# Patient Record
Sex: Female | Born: 1950 | Race: White | Hispanic: No | Marital: Single | Smoking: Never smoker
Health system: Southern US, Community
[De-identification: ages and names within clinical notes are randomized; demographics above are authoritative.]

## PROBLEM LIST (undated history)

## (undated) DIAGNOSIS — E669 Obesity, unspecified: Secondary | ICD-10-CM

## (undated) DIAGNOSIS — E66811 Obesity, class 1: Secondary | ICD-10-CM

## (undated) DIAGNOSIS — G4733 Obstructive sleep apnea (adult) (pediatric): Secondary | ICD-10-CM

## (undated) DIAGNOSIS — Z9989 Dependence on other enabling machines and devices: Secondary | ICD-10-CM

## (undated) DIAGNOSIS — K219 Gastro-esophageal reflux disease without esophagitis: Secondary | ICD-10-CM

## (undated) DIAGNOSIS — I480 Paroxysmal atrial fibrillation: Secondary | ICD-10-CM

## (undated) DIAGNOSIS — M1990 Unspecified osteoarthritis, unspecified site: Secondary | ICD-10-CM

## (undated) DIAGNOSIS — I4892 Unspecified atrial flutter: Secondary | ICD-10-CM

## (undated) DIAGNOSIS — Z780 Asymptomatic menopausal state: Secondary | ICD-10-CM

## (undated) DIAGNOSIS — J329 Chronic sinusitis, unspecified: Secondary | ICD-10-CM

## (undated) DIAGNOSIS — E785 Hyperlipidemia, unspecified: Secondary | ICD-10-CM

## (undated) HISTORY — DX: Obesity, class 1: E66.811

## (undated) HISTORY — DX: Obesity, unspecified: E66.9

## (undated) HISTORY — DX: Asymptomatic menopausal state: Z78.0

## (undated) HISTORY — DX: Unspecified atrial flutter: I48.92

## (undated) HISTORY — DX: Chronic sinusitis, unspecified: J32.9

## (undated) HISTORY — DX: Hyperlipidemia, unspecified: E78.5

## (undated) HISTORY — DX: Paroxysmal atrial fibrillation: I48.0

## (undated) HISTORY — PX: CATARACT EXTRACTION W/ INTRAOCULAR LENS  IMPLANT, BILATERAL: SHX1307

## (undated) HISTORY — DX: Gastro-esophageal reflux disease without esophagitis: K21.9

---
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
## 1998-12-01 ENCOUNTER — Other Ambulatory Visit: Admission: RE | Admit: 1998-12-01 | Discharge: 1998-12-01 | Payer: Self-pay | Admitting: *Deleted

## 2000-11-01 ENCOUNTER — Ambulatory Visit (HOSPITAL_COMMUNITY): Admission: RE | Admit: 2000-11-01 | Discharge: 2000-11-01 | Payer: Self-pay | Admitting: *Deleted

## 2001-07-24 ENCOUNTER — Ambulatory Visit (HOSPITAL_BASED_OUTPATIENT_CLINIC_OR_DEPARTMENT_OTHER): Admission: RE | Admit: 2001-07-24 | Discharge: 2001-07-24 | Payer: Self-pay | Admitting: Orthopedic Surgery

## 2001-07-24 ENCOUNTER — Encounter (INDEPENDENT_AMBULATORY_CARE_PROVIDER_SITE_OTHER): Payer: Self-pay | Admitting: Specialist

## 2001-07-26 ENCOUNTER — Emergency Department (HOSPITAL_COMMUNITY): Admission: EM | Admit: 2001-07-26 | Discharge: 2001-07-26 | Payer: Self-pay

## 2004-02-05 ENCOUNTER — Other Ambulatory Visit: Admission: RE | Admit: 2004-02-05 | Discharge: 2004-02-05 | Payer: Self-pay | Admitting: *Deleted

## 2005-02-20 ENCOUNTER — Other Ambulatory Visit: Admission: RE | Admit: 2005-02-20 | Discharge: 2005-02-20 | Payer: Self-pay | Admitting: *Deleted

## 2006-04-12 ENCOUNTER — Other Ambulatory Visit: Admission: RE | Admit: 2006-04-12 | Discharge: 2006-04-12 | Payer: Self-pay | Admitting: *Deleted

## 2007-01-24 ENCOUNTER — Ambulatory Visit (HOSPITAL_BASED_OUTPATIENT_CLINIC_OR_DEPARTMENT_OTHER): Admission: RE | Admit: 2007-01-24 | Discharge: 2007-01-24 | Payer: Self-pay | Admitting: Orthopedic Surgery

## 2007-02-21 ENCOUNTER — Ambulatory Visit (HOSPITAL_BASED_OUTPATIENT_CLINIC_OR_DEPARTMENT_OTHER): Admission: RE | Admit: 2007-02-21 | Discharge: 2007-02-21 | Payer: Self-pay | Admitting: Orthopedic Surgery

## 2007-04-11 HISTORY — PX: CARPAL TUNNEL RELEASE: SHX101

## 2009-04-27 ENCOUNTER — Other Ambulatory Visit: Admission: RE | Admit: 2009-04-27 | Discharge: 2009-04-27 | Payer: Self-pay | Admitting: Family Medicine

## 2010-08-23 NOTE — Op Note (Signed)
NAMEBENJAMIN, [name]              ACCOUNT NO.:  [id number]   MEDICAL RECORD NO.:  [id number]          PATIENT TYPE:  AMB   LOCATION:  DSC                          FACILITY:  [facility]   PHYSICIAN:  Cindee Salt, M.D.       DATE OF BIRTH:  Apr 23, 1950   DATE OF PROCEDURE:  01/24/2007  DATE OF DISCHARGE:                               OPERATIVE REPORT   PREOPERATIVE DIAGNOSIS:  Carpal tunnel syndrome, right hand.   POSTOPERATIVE DIAGNOSIS:  Carpal tunnel syndrome, right hand.   OPERATION:  Decompression right median nerve.   SURGEON:  Cindee Salt, M.D.   ASSISTANT:  Carolyne Fiscal R.N.   ANESTHESIA:  General.   HISTORY:  The patient is a 60 year old female with a history of carpal  tunnel syndrome, EMG nerve conductions positive.  This has not responded  to conservative treatment.  She has elected to undergo surgical  decompression. Pre and postoperative course discussed along with risks  and complications.  She is aware there is no guarantee with the surgery,  possibility of infection, recurrence, injury to arteries, nerves,  tendons, incomplete relief of symptoms, dystrophy.  She has elect to  proceed to have this done, questions have been encouraged and answered  and in the preoperative area the patient is seen and antibiotic given,  the extremity marked by both the patient and surgeon.   PROCEDURE:  The patient is brought to the operating room where a general  anesthetic was carried out without difficulty.  She was prepped using  DuraPrep, supine position, right arm free.  A longitudinal incision was  made in the palm after exsanguination of the limb with an Esmarch  bandage and inflation of a tourniquet to 250 mmHg.  Longitudinal  incision was made, carried down through subcutaneous tissue.  Bleeders  were electrocauterized.  Palmar fascia was split, superficial palmar  arch identified, flexor tendon of the ring little finger identified. To  the ulnar side of median nerve the carpal  retinaculum was incised with  sharp dissection, right angle and Sewall retractor were placed between  skin and forearm fascia.  The fascia was released for approximately a  centimeter and half proximal to the wrist crease under direct vision.  The canal was explored.  Area compression of the nerve was apparent.  Tenosynovium tissue was moderately thickened. No further lesions were  identified.  The wound was irrigated.  Skin was then closed with  interrupted 5-0 Vicryl Rapide sutures.  A sterile compressive dressing  and splint with the fingers free applied.  The patient tolerated the  procedure well, was taken to the recovery observation in satisfactory  condition.  She is discharged home to return to [facility] of  [place] in one week on Vicodin.          ______________________________  Cindee Salt, M.D.    GK/MEDQ  D:  01/24/2007  T:  01/25/2007  Job:  [id number]   cc:   Dellis Anes. Idell Pickles, M.D.

## 2010-08-23 NOTE — Op Note (Signed)
[name], Robin [name]              ACCOUNT NO.:  [id number]   MEDICAL RECORD NO.:  [id number]          PATIENT TYPE:  AMB   LOCATION:  DSC                          FACILITY:  [facility]   PHYSICIAN:  Cindee Salt, M.D.       DATE OF BIRTH:  April 29, 1950   DATE OF PROCEDURE:  02/21/2007  DATE OF DISCHARGE:  02/21/2007                               OPERATIVE REPORT   PREOPERATIVE DIAGNOSIS:  Carpal tunnel syndrome, left hand.   POSTOPERATIVE DIAGNOSIS:  Carpal tunnel syndrome, left hand.   OPERATION:  Decompression, left median nerve.   SURGEON:  Cindee Salt, M.D.   ASSISTANT:  Carolyne Fiscal R.N.   ANESTHESIA:  General.   HISTORY:  The patient is a 60 year old female with history of bilateral  carpal tunnel syndrome.  She has undergone release of her right side, is  admitted now for release of her left.  EMG nerve conductions are  positive.  This has not responded to conservative treatment.  She is  aware of risks and complications having undergone the right side.  Questions have been encouraged and answered.   PROCEDURE:  The patient is brought to the operating room where a general  anesthetic was carried out without difficulty.  She was prepped using  DuraPrep, supine position, left arm free.  A longitudinal incision was  made in the palm, carried down through subcutaneous tissue.  Bleeders  were electrocauterized.  Palmar fascia was split, superficial palmar  arch identified, flexor tendon to the ring and little finger identified  to the ulnar side of median nerve.  Carpal retinaculum was incised with  sharp dissection, right angle and Sewall retractor placed between skin  and forearm fascia.  The fascia released for approximately a centimeter  and half proximal to the wrist crease under direct vision.  Canal was  explored.  Area of compression to the nerve was apparent.  Moderate  thickening of the tenosynovium tissue was present.  No further lesions  were identified.  The wound was  irrigated.  Skin was closed with  interrupted 5-0 Vicryl Rapide sutures.  A sterile compressive dressing  and splint was applied.  The patient tolerated the procedure well and  was taken to the recovery room for observation in satisfactory  condition.  She will be discharged home to return to [facility] of  [place] in one week on Vicodin.           ______________________________  Cindee Salt, M.D.     GK/MEDQ  D:  02/21/2007  T:  02/22/2007  Job:  [id number]

## 2010-08-26 NOTE — Consult Note (Signed)
[place]  Patient:    Robin [name], Robin [name] Visit Number: [id number] MRN: [id number]          Service Type: EMS Location: [facility] Attending Physician:  Armanda Heritage Dictated by:   Elisha Ponder, M.D. Proc. Date: 07/26/01 Admit Date:  07/26/2001   CC:         Nicki Reaper, M.D.   Consultation Report  DATE OF BIRTH:  12/11/1950  REASON FOR CONSULTATION:  I had the pleasure to see Robin [name] in the [facility] emergency room July 26, 2001 in regards to her right upper extremity.  This patient is a very pleasant 60 year old female who underwent surgery two days ago by Dr. Merlyn Lot.  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  Subsequent to her surgery she noticed some bleeding underneath her bandage and was very concerned.  I discussed this with her over the phone, and given the fact that it was a holiday weekend she desired to be seen, etc.  I asked her to meet me at the [facility] emergency room for evaluation and treatment.  She understood the cross-coverage nature of the visit, etc.  She denies numbness or tingling, fever, chills, nausea, vomiting, or other constitutional symptoms.  She states that she has had a relatively smooth postoperative course and has not required pain medicine.  PAST MEDICAL HISTORY:  None.  PAST SURGICAL HISTORY:  Tonsillectomy and as above.  ALLERGIES:  PENICILLIN.  CURRENT MEDICATIONS:  Sulfamethoxazole antibiotic postoperatively.  She was prescribed pain medicine but does not need it, she notes.  SOCIAL HISTORY:  The patient is an Airline pilot, she is single.  PHYSICAL EXAMINATION:  GENERAL:  Reveals a very pleasant female, alert and oriented in no acute distress.  VITAL SIGNS:  Stable.  She is afebrile.  EXTREMITIES:  Right upper extremity is examined at length.  She has normal radial, median, and ulnar nerve sensation and motor function.  Flexion  and extension about the index through small fingers is intact and without deficit. The patient has a small transverse incision at the base of the long finger. The patient has no signs of infection, dystropy, or neurovascular compromise. There is no active bleeding.  There was some bleeding on the gauze; however, this is certainly quiescent at present time.  She demonstrated excellent motion and has no signs of flexor tendon irritation or abnormality.  After reviewing the wound, I have discussed with her massage desensitization and range of motion efforts and then replaced her bandage sterilely.  She will be seeing Dr. Merlyn Lot in the upcoming days (five to six days from now for follow-up).  IMPRESSION:  Status post volar mass removal, right long finger.  PLAN:  I have discussed with the patient to keep the area clean and dry.  She will be following up with Dr. Merlyn Lot next week and will proceed according to his postoperative protocol.  At present time she looks quite well and is pleased with her surgery.  I have asked her to notify us should any problems occur over the weekend.  In addition, "donts" and precautions were explained to her at length. Dictated by:   Elisha Ponder, M.D. Attending Physician:  Armanda Heritage DD:  07/26/01 TD:  07/27/01 Job: [id number]

## 2010-08-26 NOTE — Op Note (Signed)
[place]. [facility]  Patient:    Robin, [name] Visit Number: [id number] MRN: [id number]          Service Type: EMS Location: [facility] Attending Physician:  Armanda Heritage Dictated by:   Nicki Reaper, M.D. Proc. Date: 07/24/01 Admit Date:  07/26/2001 Discharge Date: 07/26/2001                             Operative Report  PREOPERATIVE DIAGNOSIS:  ______ , flexor sheath pulley cyst, left middle finger.  POSTOPERATIVE DIAGNOSIS:  ______ , flexor sheath pulley cyst, left middle finger.  OPERATION:  Release A1 pulley, excision flexor sheath cyst, left middle finger.  SURGEON:  Nicki Reaper, M.D.  ANESTHESIA:  Forearm-based IV regional.  ANESTHESIOLOGIST:  Bedelia Person, M.D.  HISTORY:  The patient is a 60 year old female with a history of triggering and a mass on the A1 pulley of her left middle finger, not responsive to conservative treatment.  PROCEDURE:  The patient was brought to the operating room where a forearm-based IV regional anesthetic was carried out without difficulty.  She was prepped and draped using Betadine scrubbing solution the left arm free.  A transverse incision was made over the cyst and carried down through subcutaneous tissue.  All bleeders electrocauterized.  Neurovascular structures protected.  The cyst was immediately encountered.  This was a large sessile-type cyst.  This was removed and sent to pathology.  The A1 pulley was then released.  The nodule was present.  No further triggering was identified. A small incision was made in the A2 pulley.  The wound was irrigated.  The skin was closed with interrupted 5-0 nylon sutures.  A sterile compressive dressing was applied.  The patient tolerated the procedure well and was taken to the recovery room for observation in satisfactory condition.  She is discharged to return to the [facility] of [place] in one week, on Vicodin and Septra DS. Dictated by:   Nicki Reaper,  M.D. Attending Physician:  Armanda Heritage DD:  07/24/01 TD:  07/24/01 Job: [id number]

## 2011-01-18 LAB — POCT HEMOGLOBIN-HEMACUE: Hemoglobin: 12.9

## 2013-01-03 ENCOUNTER — Other Ambulatory Visit: Payer: Self-pay | Admitting: Family Medicine

## 2013-01-03 ENCOUNTER — Other Ambulatory Visit (HOSPITAL_COMMUNITY)
Admission: RE | Admit: 2013-01-03 | Discharge: 2013-01-03 | Disposition: A | Discharge status: Home / Self Care | Payer: BC Managed Care – PPO | Source: Ambulatory Visit | Attending: Family Medicine | Admitting: Family Medicine

## 2013-01-03 DIAGNOSIS — Z1151 Encounter for screening for human papillomavirus (HPV): Secondary | ICD-10-CM | POA: Insufficient documentation

## 2013-01-03 DIAGNOSIS — Z124 Encounter for screening for malignant neoplasm of cervix: Secondary | ICD-10-CM | POA: Insufficient documentation

## 2013-01-08 DIAGNOSIS — I4892 Unspecified atrial flutter: Secondary | ICD-10-CM

## 2013-01-08 DIAGNOSIS — I480 Paroxysmal atrial fibrillation: Secondary | ICD-10-CM

## 2013-01-08 HISTORY — DX: Unspecified atrial flutter: I48.92

## 2013-01-08 HISTORY — DX: Paroxysmal atrial fibrillation: I48.0

## 2013-01-14 ENCOUNTER — Encounter: Payer: Self-pay | Admitting: Cardiology

## 2013-01-14 ENCOUNTER — Ambulatory Visit (INDEPENDENT_AMBULATORY_CARE_PROVIDER_SITE_OTHER): Payer: BC Managed Care – PPO | Admitting: Cardiology

## 2013-01-14 VITALS — BP 108/80 | HR 66 | Ht 62.5 in | Wt 189.4 lb

## 2013-01-14 DIAGNOSIS — R002 Palpitations: Secondary | ICD-10-CM

## 2013-01-14 DIAGNOSIS — Z8249 Family history of ischemic heart disease and other diseases of the circulatory system: Secondary | ICD-10-CM | POA: Insufficient documentation

## 2013-01-14 DIAGNOSIS — E785 Hyperlipidemia, unspecified: Secondary | ICD-10-CM

## 2013-01-14 NOTE — Assessment & Plan Note (Signed)
Monitored by PCP. She is currently on coenzyme Q 10 and omega-3 fatty acids.

## 2013-01-14 NOTE — Patient Instructions (Addendum)
Your physician recommends that you schedule a follow-up appointment in after monitor completed.  Your physician has recommended that you wear an event monitor. Event monitors are medical devices that record the heart's electrical activity. Doctors most often [place] these monitors to diagnose arrhythmias. Arrhythmias are problems with the speed or rhythm of the heartbeat. The monitor is a small, portable device. You can wear one while you do your normal daily activities. This is usually used to diagnose what is causing palpitations. You will wear it for 30 days.

## 2013-01-14 NOTE — Progress Notes (Signed)
PATIENTAnnora [name] MRN: [id number]  DOB: [birth year]   DOV:01/14/2013 PCP: Robin [name]  Clinic Note: Chief Complaint  Patient presents with  . New Patient    refer by Robin [name] for irregular beat   HPI: Robin [name] is a 62 y.o. female with a PMH below who presents today for evaluation of palpations. She is referred by Robin [name] for evaluation of palpitations and PACs. She has a strong family history with both her mother and father having atrial fibrillation. She's noted over last couple years she's had episodes of skipping beats rapid beats that last occasionally for a few seconds but the lesion spells that'll be up to a monitor to also have several of these. If not a sustained irregular heartbeat or rhythm by her description. She denies any sensation besides strange feeling of flip-flopping her chest. She denies any real lightheadedness, dizziness or wooziness associated with these symptoms. No syncope or near syncope. No TIA or amaurosis fugax symptoms  She describes having several spells where she would have a couple premature beats or skipped beats followed by a longer period with normal beating.  The remainder of Cardiovascular ROS: no chest pain or dyspnea on exertion positive for - chest pain, edema, irregular heartbeat, palpitations and Where the chest pain is the discomfort noted from GERD. negative for - loss of consciousness, murmur, orthopnea, paroxysmal nocturnal dyspnea, rapid heart rate or shortness of breath: Additional cardiac review of systems: Lightheadedness - no, dizziness - no, syncope/near-syncope - no; TIA/amaurosis fugax - no Melena - no, hematochezia no; hematuria - no; nosebleeds - no; claudication - no  Past Medical History  Diagnosis Date  . Hyperlipemia   . Menopause   . Obesity (BMI 30.0-34.9)   . GERD (gastroesophageal reflux disease)    Prior Cardiac Evaluation and Past Surgical History: Past Surgical History  Procedure Laterality  Date  . Carpal tunnel release  2009    both hands  . Tonsillectomy  1960    Allergies  Allergen Reactions  . Penicillins Hives    Current Outpatient Prescriptions  Medication Sig Dispense Refill  . Black Cohosh 540 MG CAPS Take 1 capsule by mouth daily.      . calcium gluconate 500 MG tablet Take 500 mg by mouth daily.      . Cholecalciferol (VITAMIN D) 2000 UNITS CAPS Take 1 capsule by mouth daily.      . Coenzyme Q10 (CO Q 10) 100 MG CAPS Take 1 capsule by mouth daily.      . fish oil-omega-3 fatty acids 1000 MG capsule Take 1 g by mouth daily.      Marland Kitchen FLUoxetine (PROZAC) 20 MG tablet Take 1 tablet by mouth daily.      . fluticasone (FLONASE) 50 MCG/ACT nasal spray Place 1 spray into the nose daily.      . montelukast (SINGULAIR) 10 MG tablet Take 1 tablet by mouth daily.      . Multiple Vitamin (MULTIVITAMIN WITH MINERALS) TABS tablet Take 1 tablet by mouth daily.      Marland Kitchen omeprazole (PRILOSEC) 20 MG capsule Take 1 tablet every other day       No current facility-administered medications for this visit.    History   Social History Narrative   She is low. She lives alone. 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   She never smoked and, and does not drink. She tries to exercise 10-15 minutes couple  days a week but is limited due to lack of time.   Family History: family history includes Atrial fibrillation in her father and mother; Cancer - Lung in her maternal grandfather; Heart attack in her brother and paternal grandfather; Stroke in her maternal grandmother and paternal grandmother.  ROS: A comprehensive Review of Systems - General ROS: positive for  - hot flashes ENT ROS: positive for - nasal congestion, nasal discharge, sneezing, sore throat and Postnasal drip Allergy and Immunology ROS: positive for - postnasal drip and seasonal allergies Endocrine ROS: positive for - hot flashes, temperature intolerance and Menopause related  changes Cardiovascular ROS: positive for - edema and Varicose veins/spider veins Gastrointestinal ROS: positive for - gas/bloating and heartburn  PHYSICAL EXAM BP 108/80  Pulse 66  Ht 5' 2.5" (1.588 m)  Wt 189 lb 6.4 oz (85.911 kg)  BMI 34.07 kg/m2 General appearance: alert, cooperative, appears stated age, no distress and moderately obese Neck: no adenopathy, no carotid bruit and no JVD HEENT: /AT, EOMI, MMM, anicteric sclera Lungs: clear to auscultation bilaterally, normal percussion bilaterally and non-labored Heart: regular rate and rhythm, S1, S2 normal, no murmur, click, rub or gallop ; non-displaced PMI; 1-2 beats of ectopy noted Abdomen: soft, non-tender; bowel sounds normal; no masses,  no organomegaly; mild truncal obesity Extremities: extremities normal, atraumatic, no cyanosis, and trivial edema; mild varicosities / spider veins,  Pulses: 2+ and symmetric;  Skin: mobility and turgor normal; LE varicosities without dermatitis Neurologic: Mental status: Alert, oriented, thought content appropriate Cranial nerves: normal (II-XII grossly intact)  NWG:[id number] today: Yes Rate: 66 , Rhythm: NSR, normal ECG  Per report, PAC noted on ECG with primary care provider.  Recent Labs: They were easily checked by her PCP, but labs are not available today.  ASSESSMENT / PLAN: Palpitations She is describing sounds whole leg and PACs versus PVCs. She does a family history of atrial fibrillation which is somewhat concerning, however at what she described as not consistent with atrial fibrillation except for a very short bursts. She has no other symptoms to suggest an ischemic etiology.  Plan: 30 day CardioNet Event Monitor (MCOT) 2 assess for any true arrhythmia versus PACs and PVCs. Simply because of the extensive family history of A. fib, we need to rule this out.  Depending on what is seen on the monitor, will could potentially consider additional testing such as echocardiogram or a  Myoview stress test if the indications were to be present.  Family history of cardiac arrhythmia She has a family history of a fibrillation, my suspicion is that this is probably not atrial fibrillation, however we will get an monitor with CardioNet.  Dyslipidemia, goal LDL below 130 Monitored by PCP. She is currently on coenzyme Q 10 and omega-3 fatty acids.   No orders of the defined types were placed in this encounter.    Followup: 4-6 weeks  Jetty Berland W. Herbie Baltimore, M.D., M.S. [facility] [address] [place], [place]  [address]  [phone] Pager # [phone]

## 2013-01-14 NOTE — Assessment & Plan Note (Signed)
She has a family history of a fibrillation, my suspicion is that this is probably not atrial fibrillation, however we will get an monitor with CardioNet.

## 2013-01-14 NOTE — Assessment & Plan Note (Addendum)
She is describing sounds whole leg and PACs versus PVCs. She does a family history of atrial fibrillation which is somewhat concerning, however at what she described as not consistent with atrial fibrillation except for a very short bursts. She has no other symptoms to suggest an ischemic etiology.  Plan: 30 day CardioNet Event Monitor (MCOT) 2 assess for any true arrhythmia versus PACs and PVCs. Simply because of the extensive family history of A. fib, we need to rule this out.  Depending on what is seen on the monitor, will could potentially consider additional testing such as echocardiogram or a Myoview stress test if the indications were to be present.

## 2013-01-15 ENCOUNTER — Encounter: Payer: Self-pay | Admitting: *Deleted

## 2013-01-19 ENCOUNTER — Telehealth: Payer: Self-pay | Admitting: Cardiology

## 2013-01-19 NOTE — Telephone Encounter (Signed)
I left a message for her to take 2 baby asprin a day for PAF. I told her we would call her Monday once Dr. Herbie Baltimore has reviewed the strips.  Cardionet had called that she had PAF last pm with HR 165.

## 2013-01-20 ENCOUNTER — Other Ambulatory Visit: Payer: Self-pay | Admitting: Cardiology

## 2013-01-20 ENCOUNTER — Telehealth: Payer: Self-pay | Admitting: Cardiology

## 2013-01-20 NOTE — Telephone Encounter (Signed)
Error

## 2013-01-20 NOTE — Telephone Encounter (Signed)
Left message on home and cell  phone to call back  Per Dr Herbie Baltimore, START MULTAQ 400 MG BID, CONTINUE MONTIOR E SENT PRESCRIPTION.

## 2013-01-20 NOTE — Telephone Encounter (Signed)
Spoke to patient. Per Dr orders,, He would like for her start  MULTAQ 400 mg twice a day with ameal He would  like  for her to continue wearing the cardionet monitor.    She is aware,there are samples for her to pick up and a savings card. Prescription sent to pharmacy

## 2013-01-20 NOTE — Telephone Encounter (Signed)
Mrs.Bhullar returned your call

## 2013-01-20 NOTE — Telephone Encounter (Signed)
Forwarded to sharon 

## 2013-01-20 NOTE — Telephone Encounter (Signed)
Patient has additional questions about her monitor results.  States that she received a call from [employer] yesterday.

## 2013-01-20 NOTE — Telephone Encounter (Signed)
Will start Multaq 400 mg bid. Marykay Lex, MD

## 2013-01-24 ENCOUNTER — Telehealth: Payer: Self-pay | Admitting: Cardiology

## 2013-01-24 ENCOUNTER — Encounter: Payer: Self-pay | Admitting: Cardiology

## 2013-01-24 MED ORDER — DRONEDARONE HCL 400 MG PO TABS: 400.0000 mg | ORAL_TABLET | Freq: Two times a day (BID) | ORAL | Status: DC

## 2013-01-24 NOTE — Telephone Encounter (Signed)
Please call her a new prescription in for Multaq-Call to [phone].

## 2013-01-24 NOTE — Telephone Encounter (Signed)
Rx was sent to pharmacy electronically. 

## 2013-01-30 ENCOUNTER — Telehealth: Payer: Self-pay | Admitting: Cardiology

## 2013-01-30 MED ORDER — DRONEDARONE HCL 400 MG PO TABS: 400.0000 mg | ORAL_TABLET | Freq: Two times a day (BID) | ORAL | Status: DC

## 2013-01-30 NOTE — Telephone Encounter (Signed)
High HR Afib at 185 bpm for 75sec.  Asymptomatic.  Will fax report.  Message forwarded to Dr. Herbie Baltimore.  Will place report on cart when received.

## 2013-01-30 NOTE — Telephone Encounter (Signed)
x

## 2013-01-31 ENCOUNTER — Telehealth: Payer: Self-pay | Admitting: *Deleted

## 2013-01-31 NOTE — Telephone Encounter (Signed)
Dr Herbie Baltimore reviewed monitor strips. Request that patient make an appointment sooner to discuss readings.  Unable to speak to patient-called all numbers no answer.left message - For an appointment 02/04/13 1:30pm with Dr Herbie Baltimore Request to call back

## 2013-01-31 NOTE — Telephone Encounter (Signed)
Patient returned her the call.she will be at appointment.  Informed her to go to ER if symptoms become worse. She verbalized understanding.

## 2013-02-04 ENCOUNTER — Ambulatory Visit (INDEPENDENT_AMBULATORY_CARE_PROVIDER_SITE_OTHER): Payer: BC Managed Care – PPO | Admitting: Cardiology

## 2013-02-04 ENCOUNTER — Encounter: Payer: Self-pay | Admitting: Cardiology

## 2013-02-04 VITALS — BP 118/72 | HR 66 | Ht 62.5 in | Wt 186.7 lb

## 2013-02-04 DIAGNOSIS — I4891 Unspecified atrial fibrillation: Secondary | ICD-10-CM

## 2013-02-04 DIAGNOSIS — R0789 Other chest pain: Secondary | ICD-10-CM

## 2013-02-04 DIAGNOSIS — R0602 Shortness of breath: Secondary | ICD-10-CM

## 2013-02-04 DIAGNOSIS — I480 Paroxysmal atrial fibrillation: Secondary | ICD-10-CM

## 2013-02-04 MED ORDER — DILTIAZEM HCL ER COATED BEADS 120 MG PO CP24: 120.0000 mg | ORAL_CAPSULE | Freq: Every day | ORAL | Status: DC

## 2013-02-04 NOTE — Progress Notes (Signed)
PATIENTAaliyha [name] MRN: [id number]  DOB: 09-Jan-1951   DOV:02/04/2013 PCP: Neldon Labella, MD  Clinic Note: Chief Complaint  Patient presents with  . Follow-up    Monitor readings. C/o heart racing and palps.   HPI: Robin [name] is a 62 y.o. female with a PMH below who presents today for follow up evaluation. I saw her back on October 7 for evaluation of palpations. She was referred by Dr. Sigmund Hazel for evaluation of palpitations and PACs. She has a strong family history with both her mother and father having atrial fibrillation. She wore a monitor after the last visit, and this has shown several episodes of recurrent atrial fibrillation with rates ranging from 110s to 185. She was told to start on aspirin and Multaq. However she continues to have atrial fibrillation despite being on Multaq. Lasting, but they are recurrent.   Complete report is not yet in system, as she is still wearing the monitor. Brief excerpts are scan in our  She notes that she usually feels the symptoms at nighttime, however the monitor does show that the day as well. At night when she lies down she feels a sense of pressure and tightness in her chest versus with difficulty catching her breath. Occasionally notice if she is walking and she feels her heart racing. She does not distally note any exertional chest tightness or pressure or shortness of breath unless she is having more of her rapid heart rate episodes.  The remainder of Cardiovascular ROS: no chest pain or dyspnea on exertion positive for - chest pain, irregular heartbeat, orthopnea, palpitations, rapid heart rate, shortness of breath and Where the chest pain is the discomfort noted from GERD. negative for - loss of consciousness, murmur, paroxysmal nocturnal dyspnea or rapid heart rate: Additional cardiac review of systems: Lightheadedness - no, dizziness - no, syncope/near-syncope - no; TIA/amaurosis fugax - no Melena - no, hematochezia no;  hematuria - no; nosebleeds - no; claudication - no  Past Medical History  Diagnosis Date  . Hyperlipemia   . Menopause   . Obesity (BMI 30.0-34.9)   . GERD (gastroesophageal reflux disease)   . PAF (paroxysmal atrial fibrillation)     With RVR   Prior Cardiac Evaluation and Past Surgical History: Past Surgical History  Procedure Laterality Date  . Carpal tunnel release  2009    both hands  . Tonsillectomy  1960    Allergies  Allergen Reactions  . Penicillins Hives    Current Outpatient Prescriptions  Medication Sig Dispense Refill  . calcium gluconate 500 MG tablet Take 500 mg by mouth daily.      . Cholecalciferol (VITAMIN D) 2000 UNITS CAPS Take 1 capsule by mouth daily.      . Coenzyme Q10 (CO Q 10) 100 MG CAPS Take 1 capsule by mouth daily.      Marland Kitchen dronedarone (MULTAQ) 400 MG tablet Take 1 tablet (400 mg total) by mouth 2 (two) times daily with a meal.  60 tablet  6  . fish oil-omega-3 fatty acids 1000 MG capsule Take 1 g by mouth daily.      Marland Kitchen FLUoxetine (PROZAC) 20 MG tablet Take 1 tablet by mouth daily.      . fluticasone (FLONASE) 50 MCG/ACT nasal spray Place 1 spray into the nose daily.      . montelukast (SINGULAIR) 10 MG tablet Take 1 tablet by mouth daily.      . Multiple Vitamin (MULTIVITAMIN WITH MINERALS) TABS tablet Take 1 tablet  by mouth daily.      Marland Kitchen omeprazole (PRILOSEC) 20 MG capsule Take 1 tablet every other day      . diltiazem (CARDIZEM CD) 120 MG 24 hr capsule Take 1 capsule (120 mg total) by mouth daily.  30 capsule  6   No current facility-administered medications for this visit.    History   Social History Narrative   She is low. She lives alone. 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   She never smoked and, and does not drink. She tries to exercise 10-15 minutes couple days a week but is limited due to lack of time.   Family History: family history includes Atrial fibrillation in her father and  mother; Cancer - Lung in her maternal grandfather; Heart attack in her brother and paternal grandfather; Stroke in her maternal grandmother and paternal grandmother.  ROS: A comprehensive Review of Systems - General ROS: positive for  - hot flashes ENT ROS: positive for - nasal congestion, nasal discharge, sneezing, sore throat and Postnasal drip Allergy and Immunology ROS: positive for - postnasal drip and seasonal allergies Endocrine ROS: positive for - hot flashes, temperature intolerance and Menopause related changes Cardiovascular ROS: positive for - edema and Varicose veins/spider veins Gastrointestinal ROS: positive for - gas/bloating and heartburn  PHYSICAL EXAM BP 118/72  Pulse 66  Ht 5' 2.5" (1.588 m)  Wt 186 lb 11.2 oz (84.687 kg)  BMI 33.58 kg/m2 General appearance: alert, cooperative, appears stated age, no distress and moderately obese Neck: no adenopathy, no carotid bruit and no JVD HEENT: [place]/AT, EOMI, MMM, anicteric sclera Lungs: clear to auscultation bilaterally, normal percussion bilaterally and non-labored Heart: regular rate and rhythm, S1, S2 normal, no murmur, click, rub or gallop ; non-displaced PMI; 1-2 beats of ectopy noted Abdomen: soft, non-tender; bowel sounds normal; no masses,  no organomegaly; mild truncal obesity Extremities: extremities normal, atraumatic, no cyanosis, and trivial edema; mild varicosities / spider veins,  Pulses: 2+ and symmetric;  Skin: mobility and turgor normal; LE varicosities without dermatitis Neurologic: Mental status: Alert, oriented, thought content appropriate  RUE:[id number] today: Yes Rate: 66 , Rhythm: NSR, normal ECG  Per report, PAC noted on ECG with primary care provider.  Recent Labs: They were easily checked by her PCP, but labs are not available today.  ASSESSMENT / PLAN: PAF (paroxysmal atrial fibrillation) Atrial fibrillation, not controlled by Multaq. She has frequent episodes of very rapid rates that are  somewhat symptomatic. Plan: DC Multaq, change to diltiazem 120 mg daily.   Will check Myoview stress test to evaluate for any myocardial ischemia since she is having chest pressure with these episodes. This will also allow [place] to determine a more appropriate rhythm control agent.  2-D echocardiogram to look for a accurate assessment of cardiac function and valvular function. This also allow assessment of the left atrial size, which may be important to doing potential atrial fibrillation ablation  Provider her echocardiogram and stress test showed no evidence of myocardial disease, I would consider using flecainide in addition to the calcium channel blocker.  Referred to Dr. Hillis Range from Electrophysiology, to consider the potential atrial fibrillation ablation, as this is a relatively new diagnosis in a patient with minimal if any other risk factors.  As she is 62 years old with no evidence of hypertension, diabetes, heart failure, cardiovascular disease either peripheral or carotid/cerebral, she would not meet criteria based on the CHADS2 score. At this point would  simply consider aspirin monotherapy. However given the frequent prostatism as above atrial fibrillation, we may want to consider long-term anticoagulation. I would defer in parts of the judgment to Dr. Johney Frame. Perhaps if her rhythm is better controlled and her episodes are less frequent, then she would not be any more increased risk and she is at her age..  Chest discomfort The desert usually associated with her atrial fibrillation spells. Certainly the discomfort gets just be because of the tachycardia, however cannot exclude the presence of coronary disease.   Plan: Myoview stress test  Shortness of breath Or shortness of breath is usually nocturnal, it very well it is related to ureter fibrillation episodes, however we are unsure of her overall cardiac function. Plan: A 2-D echocardiogram.   Orders Placed This Encounter    Procedures  . Ambulatory referral to Cardiac Electrophysiology    Referral Priority:  Routine    Referral Type:  Consultation    Referral Reason:  Specialty Services Required    Referred to Provider:  Hillis Range, MD    Requested Specialty:  Cardiology    Number of Visits Requested:  1  . Myocardial Perfusion Imaging    Dx afib ,chest pressure    Standing Status: Future     Number of Occurrences:      Standing Expiration Date: 02/04/2014    Order Specific Question:  Where should this test be performed    Answer:  [facility]    Order Specific Question:  Type of stress    Answer:  Exercise    Order Specific Question:  Patient weight in lbs    Answer:  186  . EKG 12-Lead  . 2D Echocardiogram without contrast    Standing Status: Future     Number of Occurrences:      Standing Expiration Date: 02/04/2014    Order Specific Question:  Type of Echo    Answer:  Complete    Order Specific Question:  Where should this test be performed    Answer:  [facility]    Order Specific Question:  Reason for exam-Echo    Answer:  Atrail Fibrillation  427.31    Order Specific Question:  Reason for exam-Echo    Answer:  Dyspnea  786.09    Followup: 4-6 weeks  DAVID W. Herbie Baltimore, M.D., M.S. [facility] [address] [place], [place]  [address]  [phone] Pager # [phone]

## 2013-02-04 NOTE — Assessment & Plan Note (Signed)
Or shortness of breath is usually nocturnal, it very well it is related to ureter fibrillation episodes, however we are unsure of her overall cardiac function. Plan: A 2-D echocardiogram.

## 2013-02-04 NOTE — Patient Instructions (Addendum)
You have been referred to Dr Johney Frame for AFIB  Your physician has requested that you have an echocardiogram. Echocardiography is a painless test that uses sound waves to create images of your heart. It provides your doctor with information about the size and shape of your heart and how well your heart's chambers and valves are working. This procedure takes approximately one hour. There are no restrictions for this procedure.   Your physician has requested that you have en exercise stress myoview. For further information please visit [email or web address] Please follow instruction sheet, as given.  STOP MULTAQ  START CARDIZEM CD (DILTIAZEM)  120 MG DAILY.   Your physician wants you to follow-up in FEB 2015. You will receive a reminder letter in the mail two months in advance. If you don't receive a letter, please call our office to schedule the follow-up appointment.

## 2013-02-04 NOTE — Assessment & Plan Note (Signed)
Atrial fibrillation, not controlled by Multaq. She has frequent episodes of very rapid rates that are somewhat symptomatic. Plan: DC Multaq, change to diltiazem 120 mg daily.   Will check Myoview stress test to evaluate for any myocardial ischemia since she is having chest pressure with these episodes. This will also allow [place] to determine a more appropriate rhythm control agent.  2-D echocardiogram to look for a accurate assessment of cardiac function and valvular function. This also allow assessment of the left atrial size, which may be important to doing potential atrial fibrillation ablation  Provider her echocardiogram and stress test showed no evidence of myocardial disease, I would consider using flecainide in addition to the calcium channel blocker.  Referred to Dr. Hillis Range from Electrophysiology, to consider the potential atrial fibrillation ablation, as this is a relatively new diagnosis in a patient with minimal if any other risk factors.  As she is 62 years old with no evidence of hypertension, diabetes, heart failure, cardiovascular disease either peripheral or carotid/cerebral, she would not meet criteria based on the CHADS2 score. At this point would simply consider aspirin monotherapy. However given the frequent prostatism as above atrial fibrillation, we may want to consider long-term anticoagulation. I would defer in parts of the judgment to Dr. Johney Frame. Perhaps if her rhythm is better controlled and her episodes are less frequent, then she would not be any more increased risk and she is at her age.Marland Kitchen

## 2013-02-04 NOTE — Assessment & Plan Note (Signed)
The desert usually associated with her atrial fibrillation spells. Certainly the discomfort gets just be because of the tachycardia, however cannot exclude the presence of coronary disease.   Plan: Myoview stress test

## 2013-02-08 HISTORY — PX: NM MYOVIEW LTD: HXRAD82

## 2013-02-08 HISTORY — PX: TRANSTHORACIC ECHOCARDIOGRAM: SHX275

## 2013-02-11 ENCOUNTER — Ambulatory Visit (HOSPITAL_COMMUNITY)
Admission: RE | Admit: 2013-02-11 | Discharge: 2013-02-11 | Disposition: A | Discharge status: Home / Self Care | Payer: BC Managed Care – PPO | Source: Ambulatory Visit | Attending: Internal Medicine | Admitting: Internal Medicine

## 2013-02-11 DIAGNOSIS — I4891 Unspecified atrial fibrillation: Secondary | ICD-10-CM

## 2013-02-11 DIAGNOSIS — R0789 Other chest pain: Secondary | ICD-10-CM

## 2013-02-11 MED ORDER — TECHNETIUM TC 99M SESTAMIBI GENERIC - CARDIOLITE: 31.1000 | Freq: Once | INTRAVENOUS | Status: AC | PRN

## 2013-02-11 MED ORDER — TECHNETIUM TC 99M SESTAMIBI GENERIC - CARDIOLITE: 10.7000 | Freq: Once | INTRAVENOUS | Status: AC | PRN

## 2013-02-11 NOTE — Procedures (Addendum)
[facility] [address] [address] [place] [address] [phone]  Cardiology Nuclear Med Study  Robin [name] is a 62 y.o. female     MRN : [id number]     DOB: 01-14-1951  Procedure Date: 02/11/2013  Nuclear Med Background Indication for Stress Test:  Evaluation for Ischemia History:  PAF W/RVR Cardiac Risk Factors: Family History - CAD, Lipids and Overweight  Symptoms:  DOE and Palpitations   Nuclear Pre-Procedure Caffeine/Decaff Intake:  1:00am NPO After: 11AM   IV Site: R Forearm  IV 0.9% NS with Angio Cath:  22g  Chest Size (in):  N/A IV Started by: Emmit Pomfret, RN  Height: 5' 2.5" (1.588 m)  Cup Size: B  BMI:  Body mass index is 33.46 kg/(m^2). Weight:  186 lb (84.369 kg)   Tech Comments:  N/A    Nuclear Med Study 1 or 2 day study: 1 day  Stress Test Type:  Stress  Order Authorizing Provider:  Bryan Lemma, MD   Resting Radionuclide: Technetium 3m Sestamibi  Resting Radionuclide Dose: 10.7 mCi   Stress Radionuclide:  Technetium 20m Sestamibi  Stress Radionuclide Dose: 31.1 mCi           Stress Protocol Rest HR: 76 Stress HR: 150  Rest BP: 118/81 Stress BP: 159/73  Exercise Time (min): 7:37 METS: 9.40   Predicted Max HR: 160 bpm % Max HR: 93.75 bpm Rate Pressure Product: 56387  Dose of Adenosine (mg):  n/a Dose of Lexiscan: n/a mg  Dose of Atropine (mg): n/a Dose of Dobutamine: n/a mcg/kg/min (at max HR)  Stress Test Technologist: Ernestene Mention, CCT Nuclear Technologist: Gonzella Lex, CNMT   Rest Procedure:  Myocardial perfusion imaging was performed at rest 45 minutes following the intravenous administration of Technetium 28m Sestamibi. Stress Procedure:  The patient performed treadmill exercise using a Bruce  Protocol for 7 minutes and 37 seconds. The patient stopped due to shortness of breath and fatigue. Patient denied any chest pain.  There were no significant ST-T wave changes.  Technetium 28m  Sestamibi was injected at peak exercise and myocardial perfusion imaging was performed after a brief delay.  Transient Ischemic Dilatation (Normal <1.22):  0.92 Lung/Heart Ratio (Normal <0.45):  0.26 QGS EDV:  63 ml QGS ESV:  17 ml LV Ejection Fraction: 74%  Signed by   .   Rest ECG: NSR - Normal EKG  Stress ECG: No significant ST segment change suggestive of ischemia; frequent PAC's and PVC's in recovery with atrial and ventricular bigeminal rhythm  QPS Raw Data Images:  Normal; no motion artifact; normal heart/lung ratio. Stress Images:  Normal homogeneous uptake in all areas of the myocardium; mild breast and basal inferior attenuation. Rest Images:  Normal homogeneous uptake in all areas of the myocardium; mild breast and basal inferior attenuation.  Subtraction (SDS):  No evidence of ischemia.  Impression Exercise Capacity:  Good exercise capacity. BP Response:  Normal blood pressure response. Clinical Symptoms:  No chest pain, mild shortnes of breath. ECG Impression:  No significant ST segment change suggestive of ischemia. Comparison with Prior Nuclear Study: No images to compare  Overall Impression:  Low risk stress nuclear study demonstrating mild attenuation artifact without significant scar or ischemia. Patient developed atrial and ventricular ectopy with bigeminy in recovery.  LV Wall Motion:  NL LV Function, EF 74%; NL Wall Motion   Joscelin Fray A, MD  02/11/2013 6:08 PM

## 2013-02-12 ENCOUNTER — Telehealth: Payer: Self-pay | Admitting: *Deleted

## 2013-02-12 MED ORDER — DILTIAZEM HCL ER COATED BEADS 120 MG PO CP24: 120.0000 mg | ORAL_CAPSULE | Freq: Every day | ORAL | Status: DC

## 2013-02-12 NOTE — Telephone Encounter (Signed)
^^  Walk-In^^  Message received that pt did not receive mail order [name] and would like a few days until it arrives.    Returned call and pt verified x 2.  Pt informed message received and Rx can be sent to pharmacy.  Pt requested 5 days and informed RN will send Rx for 7 days in case it takes a little longer than expected.  Pt verbalized understanding and agreed w/ plan.  Refill(s) sent to pharmacy.

## 2013-02-13 ENCOUNTER — Telehealth: Payer: Self-pay | Admitting: *Deleted

## 2013-02-13 NOTE — Telephone Encounter (Signed)
Message copied by Tobin Chad on Thu Feb 13, 2013 11:54 AM ------      Message from: Marykay Lex      Created: Wed Feb 12, 2013 12:11 AM       Good News!! - normal stress Test.  Normal function & no sign of CAD.            Marykay Lex, MD       ------

## 2013-02-13 NOTE — Telephone Encounter (Signed)
Left message on voicemail - normal stress test per Dr Herbie Baltimore. If any question may call back

## 2013-02-18 NOTE — Progress Notes (Signed)
Patient is aware and has an appointment with Dr Johney Frame

## 2013-02-19 ENCOUNTER — Ambulatory Visit (HOSPITAL_COMMUNITY)
Admission: RE | Admit: 2013-02-19 | Discharge: 2013-02-19 | Disposition: A | Discharge status: Home / Self Care | Payer: BC Managed Care – PPO | Source: Ambulatory Visit | Attending: Cardiology | Admitting: Cardiology

## 2013-02-19 DIAGNOSIS — R0609 Other forms of dyspnea: Secondary | ICD-10-CM | POA: Insufficient documentation

## 2013-02-19 DIAGNOSIS — R002 Palpitations: Secondary | ICD-10-CM | POA: Insufficient documentation

## 2013-02-19 DIAGNOSIS — I4891 Unspecified atrial fibrillation: Secondary | ICD-10-CM

## 2013-02-19 DIAGNOSIS — I059 Rheumatic mitral valve disease, unspecified: Secondary | ICD-10-CM | POA: Insufficient documentation

## 2013-02-19 DIAGNOSIS — I379 Nonrheumatic pulmonary valve disorder, unspecified: Secondary | ICD-10-CM | POA: Insufficient documentation

## 2013-02-19 DIAGNOSIS — R0989 Other specified symptoms and signs involving the circulatory and respiratory systems: Secondary | ICD-10-CM | POA: Insufficient documentation

## 2013-02-19 DIAGNOSIS — R0789 Other chest pain: Secondary | ICD-10-CM

## 2013-02-19 DIAGNOSIS — I079 Rheumatic tricuspid valve disease, unspecified: Secondary | ICD-10-CM | POA: Insufficient documentation

## 2013-02-19 DIAGNOSIS — E785 Hyperlipidemia, unspecified: Secondary | ICD-10-CM | POA: Insufficient documentation

## 2013-02-19 NOTE — Progress Notes (Signed)
2D Echo Performed 02/19/2013    Romey Mathieson, RCS  

## 2013-02-20 ENCOUNTER — Telehealth: Payer: Self-pay | Admitting: *Deleted

## 2013-02-20 NOTE — Telephone Encounter (Signed)
Spoke to patient. Result given . Verbalized understanding   Informed patient to call and cancel appointment with Dr Herbie Baltimore for 03/03/13, if she is satisfied with Dr Johney Frame appointment on 02/27/13. She verbalized understanding. Patient asked RN ,if Cardizem is a blood thinner,because Dr Herbie Baltimore had mention it at last visit.  RN informed patient ,that Cardizem is a calicum-channel blocker. Reviewing last office visit Dr Herbie Baltimore ,left the decision for Dr Johney Frame. Patient is aware.

## 2013-02-20 NOTE — Telephone Encounter (Signed)
Message copied by Tobin Chad on Thu Feb 20, 2013 11:31 AM ------      Message from: Marykay Lex      Created: Wed Feb 19, 2013 10:01 PM       Overall pretty normal study.        Normal EF & wall motion.  Left Atrial size is normal. ------

## 2013-02-26 ENCOUNTER — Encounter: Payer: Self-pay | Admitting: Cardiology

## 2013-02-27 ENCOUNTER — Ambulatory Visit (INDEPENDENT_AMBULATORY_CARE_PROVIDER_SITE_OTHER): Payer: BC Managed Care – PPO | Admitting: Internal Medicine

## 2013-02-27 ENCOUNTER — Encounter (INDEPENDENT_AMBULATORY_CARE_PROVIDER_SITE_OTHER): Payer: Self-pay

## 2013-02-27 ENCOUNTER — Encounter: Payer: Self-pay | Admitting: Internal Medicine

## 2013-02-27 VITALS — BP 118/80 | HR 67 | Ht 62.5 in | Wt 181.8 lb

## 2013-02-27 DIAGNOSIS — I4892 Unspecified atrial flutter: Secondary | ICD-10-CM

## 2013-02-27 DIAGNOSIS — R0683 Snoring: Secondary | ICD-10-CM

## 2013-02-27 DIAGNOSIS — I480 Paroxysmal atrial fibrillation: Secondary | ICD-10-CM

## 2013-02-27 DIAGNOSIS — R0609 Other forms of dyspnea: Secondary | ICD-10-CM

## 2013-02-27 DIAGNOSIS — I4891 Unspecified atrial fibrillation: Secondary | ICD-10-CM

## 2013-02-27 DIAGNOSIS — R0989 Other specified symptoms and signs involving the circulatory and respiratory systems: Secondary | ICD-10-CM

## 2013-02-27 MED ORDER — FLECAINIDE ACETATE 100 MG PO TABS: 100.0000 mg | ORAL_TABLET | Freq: Two times a day (BID) | ORAL | Status: DC

## 2013-02-27 NOTE — Patient Instructions (Signed)
Your physician recommends that you schedule a follow-up appointment in: 6 weeks with Dr Johney Frame  Your physician has recommended that you have a sleep study. This test records several body functions during sleep, including: brain activity, eye movement, oxygen and carbon dioxide blood levels, heart rate and rhythm, breathing rate and rhythm, the flow of air through your mouth and nose, snoring, body muscle movements, and chest and belly movement.   Your physician has recommended you make the following change in your medication:  1) Flecainide 100mg  twice daily

## 2013-02-27 NOTE — Progress Notes (Signed)
Primary Care Physician: Neldon Labella, MD Referring Physician:  Dr Harley Alto is a 62 y.o. female with a h/o recently diagnosed atrial fibrillation who presents today for EP consultation.  She reports that she was initially diagnosed with atrial fibrillation 01/2013 after presenting with symptoms of recurrent palpitations and having an event monitor placed which documented afib.  In retrospect, she has had symptoms of tachypalpitations consistent with afib for a year.  During episodes she reports chest pressure and SOB.   She finds herself coughing to "force air in".  She has decreased her chocolate/ caffeine intake with some improvement.  She has not found multaq to be helpful but finds that her palpitations are improved with diltiazem.  She reports increasing frequency and duration of episodes.  Presently, she reports having palpitations most nights though her "heart racing" spells occur every few weeks.  Episodes typically last several hours.  Today, she denies symptoms of exertional chest pain, orthopnea, PND, lower extremity edema, dizziness, presyncope, syncope, or neurologic sequela. The patient is tolerating medications without difficulties and is otherwise without complaint today.   Past Medical History  Diagnosis Date  . Hyperlipemia   . Menopause   . Obesity (BMI 30.0-34.9)   . GERD (gastroesophageal reflux disease)   . PAF (paroxysmal atrial fibrillation) 01/2013    With RVR --> Myoview 02/19/2013: No ischemia/Infarct, EF 74%;; Echo 02/19/2013 : Normal EF ~60-65%, G1 diastolic dysfxn.  . Sinusitis   . Obesity    Past Surgical History  Procedure Laterality Date  . Carpal tunnel release  2009    both hands  . Tonsillectomy  1960    Current Outpatient Prescriptions  Medication Sig Dispense Refill  . calcium gluconate 500 MG tablet Take 500 mg by mouth daily.      . Cholecalciferol (VITAMIN D) 2000 UNITS CAPS Take 1 capsule by mouth daily.      . Coenzyme  Q10 (CO Q 10) 100 MG CAPS Take 1 capsule by mouth daily.      Marland Kitchen diltiazem (CARDIZEM CD) 120 MG 24 hr capsule Take 1 capsule (120 mg total) by mouth daily.  90 capsule  3  . fish oil-omega-3 fatty acids 1000 MG capsule Take 1 g by mouth daily.      Marland Kitchen FLUoxetine (PROZAC) 20 MG tablet Take 1 tablet by mouth daily.      . fluticasone (FLONASE) 50 MCG/ACT nasal spray Place 1 spray into the nose daily.      . montelukast (SINGULAIR) 10 MG tablet Take 1 tablet by mouth daily.      . Multiple Vitamin (MULTIVITAMIN WITH MINERALS) TABS tablet Take 1 tablet by mouth daily.      Marland Kitchen omeprazole (PRILOSEC) 20 MG capsule Take 1 tablet every other day       No current facility-administered medications for this visit.    Allergies  Allergen Reactions  . Penicillins Hives    History   Social History  . Marital Status: Single    Spouse Name: N/A    Number of Children: N/A  . Years of Education: N/A   Occupational History  . Not on file.   Social History Main Topics  . Smoking status: Never Smoker   . Smokeless tobacco: Never Used  . Alcohol Use: No  . Drug Use: No  . Sexual Activity: Not on file   Other Topics Concern  . Not on file   Social History Narrative   She lives alone in [place]. 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     She never smoked and, and does not drink. She tries to exercise 10-15 minutes couple days a week but is limited due to lack of time.    Family History  Problem Relation Age of Onset  . Atrial fibrillation Mother   . Atrial fibrillation Father   . Stroke Maternal Grandmother   . Cancer - Lung Maternal Grandfather   . Stroke Paternal Grandmother   . Heart attack Paternal Grandfather   . Heart attack Brother     ROS- All systems are reviewed and negative except as per the HPI above  Physical Exam: Filed Vitals:   02/27/13 0824  BP: 118/80  Pulse: 67  Height: 5' 2.5" (1.588 m)  Weight: 181 lb 12.8 oz  (82.464 kg)    GEN- The patient is well appearing, alert and oriented x 3 today.   Head- normocephalic, atraumatic Eyes-  Sclera clear, conjunctiva pink Ears- hearing intact Oropharynx- clear Neck- supple, no JVP Lymph- no cervical lymphadenopathy Lungs- Clear to ausculation bilaterally, normal work of breathing Heart- Regular rate and rhythm, no murmurs, rubs or gallops, PMI not laterally displaced GI- soft, NT, ND, + BS Extremities- no clubbing, cyanosis, or edema MS- no significant deformity or atrophy Skin- no rash or lesion Psych- euthymic mood, full affect Neuro- strength and sensation are intact  EKG today reveals sinus rhythm 67 bpm, otherwise normal ekg, QTc 399 Event monitor from 10/14 is reviewed and reveals both afib and frequently atrial flutter with RVR Echo 02/19/13- EF 60-65%, LA 34, no significant valvular disease myoview 02/11/13- EF 74%, no scar or ischemia  Assessment and Plan:  1. Paroxysmal atrial fibrillation and atrial flutter The patient has symptomatic atrial fibrillation and atrial flutter.  She has failed medical therapy with multaq.  Per guidelines, he is not anticoagulated (CHADSVASC score is 1).  Therapeutic strategies for afib including medicine and ablation were discussed in detail with the patient today. Risk, benefits, and alternatives to EP study and radiofrequency ablation for afib were also discussed in detail today. At this point, she would prefer to try another AAD.  I have therefore initiated flecainide 100mg  BID. She snores and therefore I have recommended a sleep study. She will return for further evaluation in 6 weeks.

## 2013-03-02 DIAGNOSIS — G4733 Obstructive sleep apnea (adult) (pediatric): Secondary | ICD-10-CM | POA: Insufficient documentation

## 2013-03-02 DIAGNOSIS — I4892 Unspecified atrial flutter: Secondary | ICD-10-CM | POA: Insufficient documentation

## 2013-03-03 ENCOUNTER — Ambulatory Visit: Payer: BC Managed Care – PPO | Admitting: Cardiology

## 2013-04-16 ENCOUNTER — Ambulatory Visit (INDEPENDENT_AMBULATORY_CARE_PROVIDER_SITE_OTHER): Payer: BC Managed Care – PPO | Admitting: Internal Medicine

## 2013-04-16 ENCOUNTER — Encounter: Payer: Self-pay | Admitting: Internal Medicine

## 2013-04-16 VITALS — BP 128/80 | HR 60 | Ht 62.5 in | Wt 183.4 lb

## 2013-04-16 DIAGNOSIS — I480 Paroxysmal atrial fibrillation: Secondary | ICD-10-CM

## 2013-04-16 DIAGNOSIS — I4892 Unspecified atrial flutter: Secondary | ICD-10-CM

## 2013-04-16 DIAGNOSIS — I4891 Unspecified atrial fibrillation: Secondary | ICD-10-CM

## 2013-04-16 NOTE — Progress Notes (Signed)
PCP:  Tawanna Solo, MD Primary Cardiologist: Ellyn Hack  The patient presents today for routine electrophysiology followup.  Since last being seen in our clinic, the patient reports doing very well.  She is tolerating her Flecainide without difficulty and has had a significant decrease in her atrial fibrillation burden.  Her sleep study is scheduled for tomorrow night.  Today, she denies symptoms of palpitations, chest pain, shortness of breath, orthopnea, PND, lower extremity edema, dizziness, presyncope, syncope, or neurologic sequela.  The patient feels that she is tolerating medications without difficulties and is otherwise without complaint today.   Past Medical History  Diagnosis Date  . Hyperlipemia   . Menopause   . Obesity (BMI 30.0-34.9)   . GERD (gastroesophageal reflux disease)   . PAF (paroxysmal atrial fibrillation) 01/2013    With RVR --> Myoview 02/19/2013: No ischemia/Infarct, EF 74%;; Echo 02/19/2013 : Normal EF ~99-37%, G1 diastolic dysfxn.  . Sinusitis   . Obesity   . Atrial flutter 01/2013   Past Surgical History  Procedure Laterality Date  . Carpal tunnel release  2009    both hands  . Tonsillectomy  1960    Current Outpatient Prescriptions  Medication Sig Dispense Refill  . calcium gluconate 500 MG tablet Take 500 mg by mouth daily.      . Cholecalciferol (VITAMIN D) 2000 UNITS CAPS Take 1 capsule by mouth daily.      . Coenzyme Q10 (CO Q 10) 100 MG CAPS Take 1 capsule by mouth daily.      Marland Kitchen diltiazem (CARDIZEM CD) 120 MG 24 hr capsule Take 1 capsule (120 mg total) by mouth daily.  90 capsule  3  . fish oil-omega-3 fatty acids 1000 MG capsule Take 1 g by mouth daily.      . flecainide (TAMBOCOR) 100 MG tablet Take 1 tablet (100 mg total) by mouth 2 (two) times daily.  180 tablet  3  . FLUoxetine (PROZAC) 20 MG tablet Take 1 tablet by mouth daily.      . fluticasone (FLONASE) 50 MCG/ACT nasal spray Place 1 spray into the nose daily.      . montelukast  (SINGULAIR) 10 MG tablet Take 1 tablet by mouth daily.      . Multiple Vitamin (MULTIVITAMIN WITH MINERALS) TABS tablet Take 1 tablet by mouth daily.      Marland Kitchen omeprazole (PRILOSEC) 20 MG capsule Take 1 tablet every other day       No current facility-administered medications for this visit.    Allergies  Allergen Reactions  . Penicillins Hives    History   Social History  . Marital Status: Single    Spouse Name: N/A    Number of Children: N/A  . Years of Education: N/A   Occupational History  . Not on file.   Social History Main Topics  . Smoking status: Never Smoker   . Smokeless tobacco: Never Used  . Alcohol Use: No  . Drug Use: No  . Sexual Activity: Not on file   Other Topics Concern  . Not on file   Social History Narrative   She lives alone in [place]. 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     She never smoked and, and does not drink. She tries to exercise 10-15 minutes couple days a week but is limited due to lack of time.    Family History  Problem Relation Age of Onset  . Atrial fibrillation Mother   .  Atrial fibrillation Father   . Stroke Maternal Grandmother   . Cancer - Lung Maternal Grandfather   . Stroke Paternal Grandmother   . Heart attack Paternal Grandfather   . Heart attack Brother     ROS-  All systems are reviewed and are negative except as outlined in the HPI above  Physical Exam: Filed Vitals:   04/16/13 1003  BP: 128/80  Pulse: 60  Height: 5' 2.5" (1.588 m)  Weight: 183 lb 6.4 oz (83.19 kg)    GEN- The patient is well appearing, alert and oriented x 3 today.   Head- normocephalic, atraumatic Eyes-  Sclera clear, conjunctiva pink Ears- hearing intact Oropharynx- clear Neck- supple, no JVP Lymph- no cervical lymphadenopathy Lungs- Clear to ausculation bilaterally, normal work of breathing Heart- Regular rate and rhythm, no murmurs, rubs or gallops, PMI not laterally  displaced GI- soft, NT, ND, + BS Extremities- no clubbing, cyanosis, or edema MS- no significant deformity or atrophy Skin- no rash or lesion Psych- euthymic mood, full affect Neuro- strength and sensation are intact  EKG - sinus rhythm rate 60 normal intervals  Assessment and Plan:  1. afib Well controlled with flecanide chads2vasc is 1.  We will not start [place] at this time.

## 2013-04-16 NOTE — Patient Instructions (Signed)
Your physician wants you to follow-up in: 6 months with Dr. Allred. You will receive a reminder letter in the mail two months in advance. If you don't receive a letter, please call our office to schedule the follow-up appointment.  

## 2013-04-17 ENCOUNTER — Ambulatory Visit (HOSPITAL_BASED_OUTPATIENT_CLINIC_OR_DEPARTMENT_OTHER): Payer: BC Managed Care – PPO | Attending: Internal Medicine | Admitting: Radiology

## 2013-04-17 VITALS — Ht 62.5 in | Wt 181.0 lb

## 2013-04-17 DIAGNOSIS — I4891 Unspecified atrial fibrillation: Secondary | ICD-10-CM

## 2013-04-17 DIAGNOSIS — G4733 Obstructive sleep apnea (adult) (pediatric): Secondary | ICD-10-CM | POA: Insufficient documentation

## 2013-04-17 DIAGNOSIS — R0989 Other specified symptoms and signs involving the circulatory and respiratory systems: Secondary | ICD-10-CM | POA: Insufficient documentation

## 2013-04-17 DIAGNOSIS — R0609 Other forms of dyspnea: Secondary | ICD-10-CM | POA: Insufficient documentation

## 2013-04-23 DIAGNOSIS — I4891 Unspecified atrial fibrillation: Secondary | ICD-10-CM

## 2013-04-23 DIAGNOSIS — G4733 Obstructive sleep apnea (adult) (pediatric): Secondary | ICD-10-CM

## 2013-04-23 NOTE — Sleep Study (Signed)
   NAME: Robin [name] DATE OF BIRTH:  February 06, 1951 MEDICAL RECORD NUMBER [id number]  LOCATION: [facility]  PHYSICIAN: Kathee Delton  DATE OF STUDY: 04/17/2013  SLEEP STUDY TYPE: Nocturnal Polysomnogram               REFERRING PHYSICIAN: Thompson Grayer, MD  INDICATION FOR STUDY: Hypersomnia with sleep apnea  EPWORTH SLEEPINESS SCORE:  3 HEIGHT: 5' 2.5" (158.8 cm)  WEIGHT: 181 lb (82.101 kg)    Body mass index is 32.56 kg/(m^2).  NECK SIZE: 14 in.  SLEEP ARCHITECTURE: The patient had a total sleep time of 259 minutes with no slow-wave sleep and only 61 minutes of REM. Sleep onset latency was mildly prolonged at 38 minutes, and REM onset was prolonged as well. Sleep efficiency was poor at 70%.  RESPIRATORY DATA: The patient had 22 apneas and 115 obstructive hypopneas, giving her an AHI of 32 events per hour. The events occurred in the supine position, and were increased in frequency during REM. There was loud snoring noted throughout. The patient did not meet split-night protocol secondary to the majority of her events occurring well after 2 AM.  OXYGEN DATA: There was oxygen desaturation as low as 71% with the patient's obstructive events  CARDIAC DATA: No clinically significant arrhythmias were seen.  MOVEMENT/PARASOMNIA: No significant leg jerks or other abnormal behaviors were noted.  IMPRESSION/ RECOMMENDATION:    1) moderate obstructive sleep apnea/hypopnea syndrome, with an AHI of 32 events per hour and oxygen desaturation as low as 71%. Treatment for this degree of sleep apnea should focus on a trial of CPAP as well as aggressive weight loss.    Kathee Delton Diplomate, American Board of Sleep Medicine  ELECTRONICALLY SIGNED ON:  04/23/2013, 6:21 PM [place] PH: [phone]   FX: [phone] [place]

## 2013-05-12 ENCOUNTER — Other Ambulatory Visit: Payer: Self-pay | Admitting: *Deleted

## 2013-05-12 DIAGNOSIS — G4733 Obstructive sleep apnea (adult) (pediatric): Secondary | ICD-10-CM

## 2013-05-12 NOTE — Patient Instructions (Signed)
Will schedule an appointment with Dr Gwenette Greet to follow up on sleep study and OSA  Pt aware

## 2013-05-28 ENCOUNTER — Telehealth: Payer: Self-pay | Admitting: Internal Medicine

## 2013-05-28 NOTE — Telephone Encounter (Signed)
New message    Pt has not received a referral to a pulmonologist (lung dr) for sleep apnea.

## 2013-05-28 NOTE — Telephone Encounter (Signed)
Referral is made and Robin [name] will call and set up

## 2013-06-24 ENCOUNTER — Encounter: Payer: Self-pay | Admitting: Pulmonary Disease

## 2013-06-24 ENCOUNTER — Encounter (INDEPENDENT_AMBULATORY_CARE_PROVIDER_SITE_OTHER): Payer: Self-pay

## 2013-06-24 ENCOUNTER — Ambulatory Visit (INDEPENDENT_AMBULATORY_CARE_PROVIDER_SITE_OTHER): Payer: BC Managed Care – PPO | Admitting: Pulmonary Disease

## 2013-06-24 VITALS — BP 122/84 | HR 76 | Temp 98.0°F | Ht 62.5 in | Wt 186.0 lb

## 2013-06-24 DIAGNOSIS — G4733 Obstructive sleep apnea (adult) (pediatric): Secondary | ICD-10-CM

## 2013-06-24 NOTE — Assessment & Plan Note (Signed)
The patient has moderate obstructive sleep apnea by her recent sleep study, and is clearly symptomatic during the night and when she awakens. She also has atrial fibrillation, and it is unclear whether her sleep-disordered breathing contributes to her arrhythmia. I have had a long discussion with her about the pathophysiology of sleep apnea, including its impact to her quality of life and cardiovascular health. I have recommended a trial of CPAP while she is working on weight loss, and she is agreeable. I will set the patient up on cpap at a moderate pressure level to allow for desensitization, and will troubleshoot the device over the next 4-6weeks if needed.  The pt is to call me if having issues with tolerance.  Will then optimize the pressure once patient is able to wear cpap on a consistent basis.

## 2013-06-24 NOTE — Progress Notes (Signed)
Subjective:    Patient ID: Robin [name], female    DOB: 1950/08/25, 63 y.o.   MRN: [id number]  HPI The patient is a 63 year old female who I've been asked to see for management of obstructive sleep apnea. She underwent a sleep study in January of this year, which showed moderate OSA with an AHI of 32 events per hour and oxygen desaturation as low as 71%. The patient has a history of atrial fibrillation, and is currently seeing cardiology. She has been told that she does snore loudly, but she is unsure if she has witnessed apneas. She does have choking arousals at times. She has frequent awakenings at night, and is not rested in the mornings upon arising. She describes intermittent daytime sleep pressure with inactivity, but does not feel it is a significant issue for her. She has no sleepiness during the evening while trying during her watch television, but does have sleep pressure with driving after lunch. Her weight is neutral over the last 2 years, and her Epworth score today is 9.   Sleep Questionnaire What time do you typically go to bed?( Between what hours) 10-11 10-11 at 1417 on 06/24/13 by Virl Cagey, CMA How long does it take you to fall asleep? 2 2 at 1417 on 06/24/13 by Virl Cagey, CMA How many times during the night do you wake up? 3 3 at 1417 on 06/24/13 by Virl Cagey, CMA What time do you get out of bed to start your day? 0600 0600 at 1417 on 06/24/13 by Virl Cagey, CMA Do you drive or operate heavy machinery in your occupation? No No at 1417 on 06/24/13 by Virl Cagey, CMA How much has your weight changed (up or down) over the past two years? (In pounds) 0 oz (0 kg) 0 oz (0 kg) at 1417 on 06/24/13 by Virl Cagey, CMA Have you ever had a sleep study before? Yes Yes at 1417 on 06/24/13 by Virl Cagey, CMA If yes, location of study? [facility] at 4098 on 06/24/13 by Virl Cagey, CMA If yes, date of study? 04/2013 04/2013 at 1417 on  06/24/13 by Virl Cagey, CMA Do you currently use CPAP? No No at 1417 on 06/24/13 by Virl Cagey, CMA Do you wear oxygen at any time? No No at 1417 on 06/24/13 by Virl Cagey, CMA   Review of Systems  Constitutional: Negative for fever and unexpected weight change.  HENT: Positive for congestion and sore throat. Negative for dental problem, ear pain, nosebleeds, postnasal drip, rhinorrhea, sinus pressure, sneezing and trouble swallowing.   Eyes: Negative for redness and itching.  Respiratory: Positive for cough. Negative for chest tightness and wheezing.   Cardiovascular: Positive for palpitations. Negative for leg swelling.  Gastrointestinal: Negative for nausea and vomiting.  Genitourinary: Negative for dysuria.  Musculoskeletal: Negative for joint swelling.  Skin: Negative for rash.  Neurological: Negative for headaches.  Hematological: Does not bruise/bleed easily.  Psychiatric/Behavioral: Negative for dysphoric mood. The patient is not nervous/anxious.        Objective:   Physical Exam Constitutional:  Overweight female, no acute distress  HENT:  Nares patent without discharge, +septal deviation to the left with narrowing.   Oropharynx without exudate, palate and uvula are mildly elongated.   Eyes:  Perrla, eomi, no scleral icterus  Neck:  No JVD, no TMG  Cardiovascular:  Normal rate, regular rhythm, no rubs or gallops.  No murmurs  Intact distal pulses  Pulmonary :  Normal breath sounds, no stridor or respiratory distress   No rales, rhonchi, or wheezing  Abdominal:  Soft, nondistended, bowel sounds present.  No tenderness noted.   Musculoskeletal:  + lower extremity edema noted.  Lymph Nodes:  No cervical lymphadenopathy noted  Skin:  No cyanosis noted  Neurologic:  Alert, appropriate, moves all 4 extremities without obvious deficit.         Assessment & Plan:

## 2013-06-24 NOTE — Patient Instructions (Signed)
Will start on cpap at a moderate pressure level.  Please call me if having issues with tolerance.  Work on weight loss followup with me again in 8 weeks.

## 2013-08-19 ENCOUNTER — Ambulatory Visit: Payer: BC Managed Care – PPO | Admitting: Pulmonary Disease

## 2013-08-26 ENCOUNTER — Encounter: Payer: Self-pay | Admitting: Pulmonary Disease

## 2013-08-26 ENCOUNTER — Ambulatory Visit (INDEPENDENT_AMBULATORY_CARE_PROVIDER_SITE_OTHER): Payer: BC Managed Care – PPO | Admitting: Pulmonary Disease

## 2013-08-26 ENCOUNTER — Encounter (INDEPENDENT_AMBULATORY_CARE_PROVIDER_SITE_OTHER): Payer: Self-pay

## 2013-08-26 VITALS — BP 118/70 | HR 69 | Temp 98.8°F | Ht 62.5 in | Wt 190.0 lb

## 2013-08-26 DIAGNOSIS — G4733 Obstructive sleep apnea (adult) (pediatric): Secondary | ICD-10-CM

## 2013-08-26 NOTE — Assessment & Plan Note (Signed)
The patient is tolerating CPAP fairly well, and has seen some improvement in that she sleeps. She still has issues with pressure at times, but it is slowly getting better. At this point, we need to optimize her pressure, and I think the auto setting will be more comfortable for her. I have also encouraged her to keep up with her mask changes and supplies, and to work aggressively on weight loss.

## 2013-08-26 NOTE — Patient Instructions (Signed)
Will put your machine on the auto setting.  Please call if you are having issues with tolerance. Work on weight loss. followup with me again in 14mos.

## 2013-08-26 NOTE — Progress Notes (Signed)
   Subjective:    Patient ID: Robin [name], female    DOB: 1950/10/22, 63 y.o.   MRN: [id number]  HPI Patient comes in today for followup of her obstructive sleep apnea. She is wearing CPAP compliantly, but is having issues with pressure at times. She will sometimes awaken and feels smothering, and pull the mask off. The last 3 nights she has slept all the way through, and felt much better in the mornings upon awakening.   Review of Systems  Constitutional: Negative for fever and unexpected weight change.  HENT: Negative for congestion, dental problem, ear pain, nosebleeds, postnasal drip, rhinorrhea, sinus pressure, sneezing, sore throat and trouble swallowing.   Eyes: Negative for redness and itching.  Respiratory: Negative for cough, chest tightness, shortness of breath and wheezing.   Cardiovascular: Negative for palpitations and leg swelling.  Gastrointestinal: Negative for nausea and vomiting.  Genitourinary: Negative for dysuria.  Musculoskeletal: Negative for joint swelling.  Skin: Negative for rash.  Neurological: Negative for headaches.  Hematological: Does not bruise/bleed easily.  Psychiatric/Behavioral: Negative for dysphoric mood. The patient is not nervous/anxious.        Objective:   Physical Exam Overweight female in no acute distress Nose without purulence or discharge noted No skin breakdown or pressure necrosis from the CPAP mask Neck without lymphadenopathy or thyromegaly Lower extremities with mild edema, no cyanosis Alert and oriented, moves all 4 extremities.       Assessment & Plan:

## 2013-09-04 ENCOUNTER — Ambulatory Visit: Payer: BC Managed Care – PPO | Admitting: Pulmonary Disease

## 2013-12-13 ENCOUNTER — Other Ambulatory Visit: Payer: Self-pay | Admitting: Cardiology

## 2013-12-16 NOTE — Telephone Encounter (Signed)
Rx refill sent to patient pharmacy   

## 2013-12-22 ENCOUNTER — Other Ambulatory Visit: Payer: Self-pay | Admitting: Internal Medicine

## 2014-02-25 ENCOUNTER — Ambulatory Visit (INDEPENDENT_AMBULATORY_CARE_PROVIDER_SITE_OTHER): Payer: 59 | Admitting: Pulmonary Disease

## 2014-02-25 ENCOUNTER — Encounter: Payer: Self-pay | Admitting: Pulmonary Disease

## 2014-02-25 ENCOUNTER — Encounter (INDEPENDENT_AMBULATORY_CARE_PROVIDER_SITE_OTHER): Payer: Self-pay

## 2014-02-25 VITALS — BP 122/74 | HR 71 | Temp 97.8°F | Ht 62.5 in | Wt 197.0 lb

## 2014-02-25 DIAGNOSIS — G4733 Obstructive sleep apnea (adult) (pediatric): Secondary | ICD-10-CM

## 2014-02-25 NOTE — Patient Instructions (Signed)
Continue on cpap, and keep up with mask changes and supplies. Work on weight loss followup with me again in one year.  

## 2014-02-25 NOTE — Assessment & Plan Note (Signed)
The pt is doing well with cpap, and is satisfied with her sleep and daytime alertness.  Her download shows excellent control of her AHI, and good sealing of the mask.  I have encouraged her to keep working on weight loss, and to keep up with mask changes and supplies.

## 2014-02-25 NOTE — Progress Notes (Signed)
   Subjective:    Patient ID: Robin [name], female    DOB: Jul 18, 1950, 63 y.o.   MRN: [id number]  HPI The patient comes in today for follow-up of her obstructive sleep apnea. We have been able to get her download, and she has excellent compliance and good control of her AHI. She has no significant mask leak, and feels that she is sleeping well with the device. She is satisfied with her daytime alertness.   Review of Systems  Constitutional: Negative for fever and unexpected weight change.  HENT: Negative for congestion, dental problem, ear pain, nosebleeds, postnasal drip, rhinorrhea, sinus pressure, sneezing, sore throat and trouble swallowing.   Eyes: Negative for redness and itching.  Respiratory: Negative for cough, chest tightness, shortness of breath and wheezing.   Cardiovascular: Negative for palpitations and leg swelling.  Gastrointestinal: Negative for nausea and vomiting.  Genitourinary: Negative for dysuria.  Musculoskeletal: Negative for joint swelling.  Skin: Negative for rash.  Neurological: Negative for headaches.  Hematological: Does not bruise/bleed easily.  Psychiatric/Behavioral: Negative for dysphoric mood. The patient is not nervous/anxious.        Objective:   Physical Exam Overweight female in no acute distress Nose without purulence or discharge noted No skin breakdown or pressure necrosis from the C Pap mask Neck without lymphadenopathy or thyromegaly Lower extremities with no significant edema, no cyanosis Alert and oriented, moves all 4 extremities and does not appear to be sleepy.       Assessment & Plan:

## 2014-04-24 ENCOUNTER — Other Ambulatory Visit: Payer: Self-pay | Admitting: Internal Medicine

## 2014-04-24 ENCOUNTER — Other Ambulatory Visit: Payer: Self-pay | Admitting: Cardiology

## 2014-04-24 NOTE — Telephone Encounter (Signed)
Rx refill sent to patient pharmacy   

## 2014-05-26 ENCOUNTER — Telehealth: Payer: Self-pay | Admitting: Cardiology

## 2014-05-26 NOTE — Telephone Encounter (Addendum)
°  1. Which medications need to be refilled? Cartia XT 120mg   2. Which pharmacy is medication to be sent to?[facility]  3. Do they need a 30 day or 90 day supply? 90  4. Would they like a call back once the medication has been sent to the pharmacy? No  *PT STATES THAT SHE ONLY HAS 7 PILLS LEFT*

## 2014-05-28 MED ORDER — DILTIAZEM HCL ER COATED BEADS 120 MG PO CP24: 120.0000 mg | ORAL_CAPSULE | Freq: Every day | ORAL | Status: DC

## 2014-05-28 NOTE — Telephone Encounter (Signed)
Refill sent into the pharmacy 

## 2014-08-13 ENCOUNTER — Encounter: Payer: Self-pay | Admitting: Cardiology

## 2014-08-13 ENCOUNTER — Ambulatory Visit (INDEPENDENT_AMBULATORY_CARE_PROVIDER_SITE_OTHER): Payer: BLUE CROSS/BLUE SHIELD | Admitting: Cardiology

## 2014-08-13 VITALS — BP 114/70 | HR 72 | Ht 62.0 in | Wt 192.0 lb

## 2014-08-13 DIAGNOSIS — E785 Hyperlipidemia, unspecified: Secondary | ICD-10-CM

## 2014-08-13 DIAGNOSIS — I483 Typical atrial flutter: Secondary | ICD-10-CM

## 2014-08-13 DIAGNOSIS — I480 Paroxysmal atrial fibrillation: Secondary | ICD-10-CM

## 2014-08-13 DIAGNOSIS — R0789 Other chest pain: Secondary | ICD-10-CM | POA: Diagnosis not present

## 2014-08-13 MED ORDER — FLECAINIDE ACETATE 100 MG PO TABS: ORAL_TABLET | ORAL | Status: DC

## 2014-08-13 MED ORDER — DILTIAZEM HCL ER COATED BEADS 120 MG PO CP24: 120.0000 mg | ORAL_CAPSULE | Freq: Every day | ORAL | Status: DC

## 2014-08-13 NOTE — Progress Notes (Signed)
2  PCP: Tawanna Solo, MD  Clinic Note: Chief Complaint  Patient presents with  . Annual Exam    hasnt been here 19 months,occasional a fib. no chest discomfort or leg pain or swelling  . Atrial Fibrillation    HPI: Robin [name] is a 64 y.o. female with a PMH below who presents today for delayed f/u of Afib/Flutter. Last saw Dr.Allred - started on Flecainide + CCB. I have not seen her since 02/04/2013- I referred her to Dr. Rayann Heman atrial fibrillation clinic. She had recurrent A. fib despite the treatment with Multaq.  The consideration was alternate medication treatment versus atrial fibrillation ablation. Ultimately they decided to use flecainide. She really seems to been very well controlled since then and now presents for routine follow-up. She was last seen by Dr. Rayann Heman in January 2015.   Past Medical History  Diagnosis Date  . Hyperlipemia   . Menopause   . Obesity (BMI 30.0-34.9)   . GERD (gastroesophageal reflux disease)   . PAF (paroxysmal atrial fibrillation) 01/2013    With RVR --> Myoview 02/19/2013: No ischemia/Infarct, EF 74%;; Echo 02/19/2013 : Normal EF ~41-74%, G1 diastolic dysfxn.  . Atrial flutter 01/2013  . Sinusitis   . Obesity     Prior Cardiac Evaluation and Past Surgical History: Past Surgical History  Procedure Laterality Date  . Carpal tunnel release  2009    both hands  . Tonsillectomy  1960  . Transthoracic echocardiogram  November 2014    EF 60-65%. Normal LV size, thickness and function. Gr1 DD. No valvular lesions.   . Nm myoview ltd  November 2014    LOW RISK study, EF 74%. Mild attenuation artifact.    Interval History: She has been doing pretty well overall from a cardiac standpoint with minimal symptoms at all. Maybe 12 brief (less than 20 minute) spells since her last visit with Dr. Rayann Heman. Based on her CHADS2VASC2 score of 1, she was not placed on anticoagulation besides aspirin. They discussed alternatives and she would prefer to  start with flecainide with diltiazem.. She has had a polysomnogram as well as stress test and echocardiogram reviewed above. The echo in [place] were normal, but she is now on CPAP treatment for OSA.  Cardiovascular ROS: positive for - irregular heartbeat and ~12 spells over last ~yr; mild LE edema negative for - chest pain, dyspnea on exertion, loss of consciousness, murmur, orthopnea, paroxysmal nocturnal dyspnea, rapid heart rate, shortness of breath or No TIA/amaurosis fugax symptoms or syncope/near syncope. No claudication.  ROS: A comprehensive was performed. Review of Systems  HENT: Positive for congestion. Negative for nosebleeds.   Respiratory: Negative for cough and shortness of breath.        Allergies  Cardiovascular: Positive for leg swelling (@ end of long day).  Gastrointestinal: Negative for blood in stool and melena.  Genitourinary: Negative for hematuria.  Musculoskeletal: Positive for back pain and joint pain.  Endo/Heme/Allergies: Does not bruise/bleed easily.  Psychiatric/Behavioral: The patient is nervous/anxious.   All other systems reviewed and are negative.   Current Outpatient Prescriptions on File Prior to Visit  Medication Sig Dispense Refill  . aspirin 81 MG tablet Take 81 mg by mouth daily.    . calcium citrate-vitamin D 500-400 MG-UNIT chewable tablet Chew 1 tablet by mouth daily.    . fish oil-omega-3 fatty acids 1000 MG capsule Take 1 g by mouth daily.    Marland Kitchen FLUoxetine (PROZAC) 20 MG tablet Take 1 tablet by mouth daily.    Marland Kitchen  Multiple Vitamin (MULTIVITAMIN WITH MINERALS) TABS tablet Take 1 tablet by mouth daily.    Marland Kitchen omeprazole (PRILOSEC) 20 MG capsule Take 1 tablet every other day     No current facility-administered medications on file prior to visit.   Allergies  Allergen Reactions  . Penicillins Hives    History  Substance Use Topics  . Smoking status: Never Smoker   . Smokeless tobacco: Never Used  . Alcohol Use: No   Family History    Problem Relation Age of Onset  . Atrial fibrillation Mother   . Atrial fibrillation Father   . Stroke Maternal Grandmother   . Cancer - Lung Maternal Grandfather   . Stroke Paternal Grandmother   . Heart attack Paternal Grandfather   . Heart attack Brother     Wt Readings from Last 3 Encounters:  08/13/14 87.091 kg (192 lb)  02/25/14 89.359 kg (197 lb)  08/26/13 86.183 kg (190 lb)    PHYSICAL EXAM BP 114/70 mmHg  Pulse 72  Ht 5\' 2"  (1.575 m)  Wt 87.091 kg (192 lb)  BMI 35.11 kg/m2 General appearance: alert, cooperative, appears stated age, no distress and moderately obese Neck: no adenopathy, no carotid bruit and no JVD HEENT: [place]/AT, EOMI, MMM, anicteric sclera Lungs: clear to auscultation bilaterally, normal percussion bilaterally and non-labored Heart: regular rate and rhythm, S1, S2 normal, no murmur, click, rub or gallop ; non-displaced PMI; 1-2 beats of ectopy noted Abdomen: soft, non-tender; bowel sounds normal; no masses, no organomegaly; mild truncal obesity Extremities: extremities normal, atraumatic, no cyanosis, and trivial edema; mild varicosities / spider veins,  Pulses: 2+ and symmetric;  Skin: mobility and turgor normal; LE varicosities without dermatitis Neurologic: Mental status: Alert, oriented, thought content appropriate   Adult ECG Report  Rate: 72 ;  Rhythm: normal sinus rhythm and Otherwise normal EKG. Normal axis, intervals and durations. Normal voltage  Narrative Interpretation: Normal EKG   Recent Labs:   None available No results found for: CHOL, HDL, LDLCALC, LDLDIRECT, TRIG, CHOLHDL   ASSESSMENT / PLAN: Problem List Items Addressed This Visit    Chest pressure    These are only associated with A. fib spells. Hence the reason for reducing the episodes. Relatively stable with current dose of flecainide plus calcium channel blocker.      Dyslipidemia, goal LDL below 130 (Chronic)    Monitored by PCP. On omega-3 fatty acids       Relevant Medications   diltiazem (CARTIA XT) 120 MG 24 hr capsule   flecainide (TAMBOCOR) 100 MG tablet   Other Relevant Orders   EKG 12-Lead (Completed)   PAF (paroxysmal atrial fibrillation) - Primary (Chronic)    Several short bursts. It is mostly controlled with flecainide with calcium channel blocker. We talked about when necessary options for taking additional flecainide to try to cardiovert chemically. Tolerating diltiazem without issues. Only on aspirin for stroke prevention as indicated above.      Relevant Medications   diltiazem (CARTIA XT) 120 MG 24 hr capsule   flecainide (TAMBOCOR) 100 MG tablet   Other Relevant Orders   EKG 12-Lead (Completed)    Other Visit Diagnoses    Typical atrial flutter        Relevant Medications    diltiazem (CARTIA XT) 120 MG 24 hr capsule    flecainide (TAMBOCOR) 100 MG tablet       Meds ordered this encounter  Medications  . cholecalciferol (VITAMIN D) 1000 UNITS tablet    Sig: Take 1,000 Units  by mouth daily.  Marland Kitchen diltiazem (CARTIA XT) 120 MG 24 hr capsule    Sig: Take 1 capsule (120 mg total) by mouth daily.    Dispense:  90 capsule    Refill:  3  . flecainide (TAMBOCOR) 100 MG tablet    Sig: TAKE 1 BY MOUTH TWICE DAILY.    Dispense:  180 tablet    Refill:  3     Followup: One year    Garyson Stelly, Leonie Green, M.D., M.S. Interventional Cardiologist   Pager # [phone]

## 2014-08-13 NOTE — Patient Instructions (Signed)
Refill completed  May use melanoton for sleep.   Your physician wants you to follow-up in 12 month DR HARDING.  You will receive a reminder letter in the mail two months in advance. If you don't receive a letter, please call our office to schedule the follow-up appointment.

## 2014-08-15 ENCOUNTER — Encounter: Payer: Self-pay | Admitting: Cardiology

## 2014-08-15 NOTE — Assessment & Plan Note (Signed)
Monitored by PCP. On omega-3 fatty acids

## 2014-08-15 NOTE — Assessment & Plan Note (Signed)
These are only associated with A. fib spells. Hence the reason for reducing the episodes. Relatively stable with current dose of flecainide plus calcium channel blocker.

## 2014-08-15 NOTE — Assessment & Plan Note (Signed)
Several short bursts. It is mostly controlled with flecainide with calcium channel blocker. We talked about when necessary options for taking additional flecainide to try to cardiovert chemically. Tolerating diltiazem without issues. Only on aspirin for stroke prevention as indicated above.

## 2014-11-30 ENCOUNTER — Other Ambulatory Visit: Payer: Self-pay

## 2014-11-30 ENCOUNTER — Encounter: Payer: Self-pay | Admitting: Internal Medicine

## 2014-11-30 ENCOUNTER — Ambulatory Visit (INDEPENDENT_AMBULATORY_CARE_PROVIDER_SITE_OTHER): Payer: BLUE CROSS/BLUE SHIELD | Admitting: Internal Medicine

## 2014-11-30 VITALS — BP 112/70 | HR 60 | Ht 62.0 in | Wt 196.0 lb

## 2014-11-30 DIAGNOSIS — I480 Paroxysmal atrial fibrillation: Secondary | ICD-10-CM | POA: Diagnosis not present

## 2014-11-30 DIAGNOSIS — G4733 Obstructive sleep apnea (adult) (pediatric): Secondary | ICD-10-CM

## 2014-11-30 DIAGNOSIS — I483 Typical atrial flutter: Secondary | ICD-10-CM | POA: Diagnosis not present

## 2014-11-30 NOTE — Patient Instructions (Signed)
Medication Instructions:  Your physician has recommended you make the following change in your medication:  1) Stop Aspirin mid-late Oct when you see Robin Palau, NP  2) Start Xarelto 20 mg daily ---Will call in at [facility] mid- lateOct   Labwork: None ordered---will have pre procedure labs at the afib clinic  Testing/Procedures: Your physician has recommended that you have an ablation. Catheter ablation is a medical procedure used to treat some cardiac arrhythmias (irregular heartbeats). During catheter ablation, a long, thin, flexible tube is put into a blood vessel in your groin (upper thigh), or neck. This tube is called an ablation catheter. It is then guided to your heart through the blood vessel. Radio frequency waves destroy small areas of heart tissue where abnormal heartbeats may cause an arrhythmia to start. Please see the instruction sheet given to you today.    Follow-Up: Your physician recommends that you schedule a follow-up appointment with Robin Palau, NP mid to late  of Oct.---To sch ablation in Nov   Any Other Special Instructions Will Be Listed Below (If Applicable).

## 2014-11-30 NOTE — Progress Notes (Signed)
PCP:  Tawanna Solo, MD Primary Cardiologist: Ellyn Hack  The patient presents today for routine electrophysiology followup.  Since last being seen in our clinic, the patient reports doing ok. Her afib has increased in frequency and duration.  Presently she has 3-4 episodes per week, lasting 41min-1hour.  She reports tachypalpitations and fatigue.   Today, she denies symptoms of palpitations, chest pain, shortness of breath, orthopnea, PND, lower extremity edema, dizziness, presyncope, syncope, or neurologic sequela.  The patient feels that she is tolerating medications without difficulties and is otherwise without complaint today.   Past Medical History  Diagnosis Date  . Hyperlipemia   . Menopause   . Obesity (BMI 30.0-34.9)   . GERD (gastroesophageal reflux disease)   . PAF (paroxysmal atrial fibrillation) 01/2013    With RVR --> Myoview 02/19/2013: No ischemia/Infarct, EF 74%;; Echo 02/19/2013 : Normal EF ~62-37%, G1 diastolic dysfxn.  . Atrial flutter 01/2013  . Sinusitis   . Obesity    Past Surgical History  Procedure Laterality Date  . Carpal tunnel release  2009    both hands  . Tonsillectomy  1960  . Transthoracic echocardiogram  November 2014    EF 60-65%. Normal LV size, thickness and function. Gr1 DD. No valvular lesions.   . Nm myoview ltd  November 2014    LOW RISK study, EF 74%. Mild attenuation artifact.    Current Outpatient Prescriptions  Medication Sig Dispense Refill  . aspirin 81 MG tablet Take 81 mg by mouth daily.    . calcium citrate-vitamin D 500-400 MG-UNIT chewable tablet Chew 1 tablet by mouth daily.    . cholecalciferol (VITAMIN D) 1000 UNITS tablet Take 1,000 Units by mouth daily.    Marland Kitchen diltiazem (CARTIA XT) 120 MG 24 hr capsule Take 1 capsule (120 mg total) by mouth daily. 90 capsule 3  . fish oil-omega-3 fatty acids 1000 MG capsule Take 1 g by mouth daily.    . flecainide (TAMBOCOR) 100 MG tablet TAKE 1 BY MOUTH TWICE DAILY. 180 tablet 3  .  FLUoxetine (PROZAC) 20 MG tablet Take 1 tablet by mouth daily.    . Multiple Vitamin (MULTIVITAMIN WITH MINERALS) TABS tablet Take 1 tablet by mouth daily.    Marland Kitchen ofloxacin (OCUFLOX) 0.3 % ophthalmic solution Place 1 drop into the right eye 2 (two) times daily.  1  . omeprazole (PRILOSEC) 20 MG capsule Take 1 tablet every day    . PROLENSA 0.07 % SOLN Place 1 drop into the right eye as directed.  1   No current facility-administered medications for this visit.    Allergies  Allergen Reactions  . Penicillins Hives    Social History   Social History  . Marital Status: Single    Spouse Name: N/A  . Number of Children: N/A  . Years of Education: N/A   Occupational History  . accountant    Social History Main Topics  . Smoking status: Never Smoker   . Smokeless tobacco: Never Used  . Alcohol Use: No  . Drug Use: No  . Sexual Activity: Not on file   Other Topics Concern  . Not on file   Social History Narrative   She lives alone in [place]. 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     She never smoked and, and does not drink. She tries to exercise 10-15 minutes couple days a week but is limited due to lack of time.    Family  History  Problem Relation Age of Onset  . Atrial fibrillation Mother   . Atrial fibrillation Father   . Stroke Maternal Grandmother   . Cancer - Lung Maternal Grandfather   . Stroke Paternal Grandmother   . Heart attack Paternal Grandfather   . Heart attack Brother     ROS-  All systems are reviewed and are negative except as outlined in the HPI above  Physical Exam: Filed Vitals:   11/30/14 1008  BP: 112/70  Pulse: 60  Height: 5\' 2"  (1.575 m)  Weight: 88.905 kg (196 lb)    GEN- The patient is well appearing, alert and oriented x 3 today.   Head- normocephalic, atraumatic Eyes-  Sclera clear, conjunctiva pink Ears- hearing intact Oropharynx- clear Neck- supple, no JVP Lymph- no cervical  lymphadenopathy Lungs- Clear to ausculation bilaterally, normal work of breathing Heart- Regular rate and rhythm, no murmurs, rubs or gallops, PMI not laterally displaced GI- soft, NT, ND, + BS Extremities- no clubbing, cyanosis, or edema MS- no significant deformity or atrophy Skin- no rash or lesion Psych- euthymic mood, full affect Neuro- strength and sensation are intact  EKG - sinus rhythm rate 60 normal intervals  Assessment and Plan:  1. Afib/ atrial flutter She has failed medical therapy with diltiazem and flecainide chads2vasc is 1. She reports vagal triggers such as heavy meals, indigestion, or late evenings as triggers. Therapeutic strategies for afib including medicine and ablation were discussed in detail with the patient today. Risk, benefits, and alternatives to EP study and radiofrequency ablation for afib were also discussed in detail today. These risks include but are not limited to stroke, bleeding, vascular damage, tamponade, perforation, damage to the esophagus, lungs, and other structures, pulmonary vein stenosis, worsening renal function, and death. The patient understands these risk and wishes to proceed after bilateral cataract surgery.  No changes are made today. She will stop asa and start xarelto on 01/09/15 and follow-up with Roderic Palau NP at that time with plans for PVI after 3 weeks.  2. OSA Reports compliance with CPAP

## 2015-01-25 ENCOUNTER — Encounter (HOSPITAL_COMMUNITY): Payer: Self-pay | Admitting: Nurse Practitioner

## 2015-01-25 ENCOUNTER — Ambulatory Visit (HOSPITAL_COMMUNITY)
Admission: RE | Admit: 2015-01-25 | Discharge: 2015-01-25 | Disposition: A | Discharge status: Home / Self Care | Payer: BLUE CROSS/BLUE SHIELD | Source: Ambulatory Visit | Attending: Nurse Practitioner | Admitting: Nurse Practitioner

## 2015-01-25 ENCOUNTER — Other Ambulatory Visit (HOSPITAL_COMMUNITY): Payer: Self-pay | Admitting: *Deleted

## 2015-01-25 VITALS — BP 120/76 | HR 73 | Ht 62.0 in | Wt 193.0 lb

## 2015-01-25 DIAGNOSIS — I480 Paroxysmal atrial fibrillation: Secondary | ICD-10-CM | POA: Diagnosis present

## 2015-01-25 DIAGNOSIS — G4733 Obstructive sleep apnea (adult) (pediatric): Secondary | ICD-10-CM | POA: Insufficient documentation

## 2015-01-25 DIAGNOSIS — I4819 Other persistent atrial fibrillation: Secondary | ICD-10-CM

## 2015-01-25 LAB — BASIC METABOLIC PANEL
ANION GAP: 6 (ref 5–15)
BUN: 9 mg/dL (ref 6–20)
CO2: 28 mmol/L (ref 22–32)
Calcium: 9.7 mg/dL (ref 8.9–10.3)
Chloride: 106 mmol/L (ref 101–111)
Creatinine, Ser: 0.87 mg/dL (ref 0.44–1.00)
GFR calc Af Amer: >60 mL/min (ref >60)
GFR calc non Af Amer: >60 mL/min (ref >60)
GLUCOSE: 92 mg/dL (ref 65–99)
Potassium: 4 mmol/L (ref 3.5–5.1)
Sodium: 140 mmol/L (ref 135–145)

## 2015-01-25 MED ORDER — RIVAROXABAN 20 MG PO TABS: 20.0000 mg | ORAL_TABLET | Freq: Every day | ORAL | Status: DC

## 2015-01-25 NOTE — Patient Instructions (Signed)
Your physician has recommended you make the following change in your medication:  1)Stop aspirin 2)xarelto 20mg  once a day with supper  Robin [name] will call you with ablation scheduling and will send you a letter with all the details.

## 2015-01-25 NOTE — Progress Notes (Signed)
Patient ID: Robin [name], female   DOB: 1951/01/26, 64 y.o.   MRN: [id number]     Primary Care Physician: Tawanna Solo, MD Referring Physician: Dr. Charlies Constable is a 64 y.o. female with a h/o PAF that was seen by Dr. Rayann Heman in August and consented for PVI but was not scheduled at that time. She is is the afib clinic today for further discussion of PVI and to get scheduled. She has a chadsvasc score of 1(female) and is currently taking asa and will need to start xatelto 20 mg qd at least 3 weeks prior to procedure.She does not report any issues with afib recently but in the recent past was having breakthorugh  PAF on flecainide/cardizem.  Today, she denies symptoms of palpitations, chest pain, shortness of breath, orthopnea, PND, lower extremity edema, dizziness, presyncope, syncope, or neurologic sequela. The patient is tolerating medications without difficulties and is otherwise without complaint today.   Past Medical History  Diagnosis Date  . Hyperlipemia   . Menopause   . Obesity (BMI 30.0-34.9)   . GERD (gastroesophageal reflux disease)   . PAF (paroxysmal atrial fibrillation) ([place]) 01/2013    With RVR --> Myoview 02/19/2013: No ischemia/Infarct, EF 74%;; Echo 02/19/2013 : Normal EF ~40-98%, G1 diastolic dysfxn.  . Atrial flutter ([place]) 01/2013  . Sinusitis   . Obesity    Past Surgical History  Procedure Laterality Date  . Carpal tunnel release  2009    both hands  . Tonsillectomy  1960  . Transthoracic echocardiogram  November 2014    EF 60-65%. Normal LV size, thickness and function. Gr1 DD. No valvular lesions.   . Nm myoview ltd  November 2014    LOW RISK study, EF 74%. Mild attenuation artifact.    Current Outpatient Prescriptions  Medication Sig Dispense Refill  . calcium citrate-vitamin D 500-400 MG-UNIT chewable tablet Chew 1 tablet by mouth daily.    . cholecalciferol (VITAMIN D) 1000 UNITS tablet Take 1,000 Units by mouth daily.    Marland Kitchen diltiazem  (CARTIA XT) 120 MG 24 hr capsule Take 1 capsule (120 mg total) by mouth daily. 90 capsule 3  . fish oil-omega-3 fatty acids 1000 MG capsule Take 1 g by mouth daily.    . flecainide (TAMBOCOR) 100 MG tablet TAKE 1 BY MOUTH TWICE DAILY. 180 tablet 3  . FLUoxetine (PROZAC) 20 MG tablet Take 1 tablet by mouth daily.    . Multiple Vitamin (MULTIVITAMIN WITH MINERALS) TABS tablet Take 1 tablet by mouth daily.    Marland Kitchen omeprazole (PRILOSEC) 20 MG capsule Take 1 tablet every day    . rivaroxaban (XARELTO) 20 MG TABS tablet Take 1 tablet (20 mg total) by mouth daily with supper. 90 tablet 3   No current facility-administered medications for this encounter.    Allergies  Allergen Reactions  . Penicillins Hives    Social History   Social History  . Marital Status: Single    Spouse Name: N/A  . Number of Children: N/A  . Years of Education: N/A   Occupational History  . accountant    Social History Main Topics  . Smoking status: Never Smoker   . Smokeless tobacco: Never Used  . Alcohol Use: No  . Drug Use: No  . Sexual Activity: Not on file   Other Topics Concern  . Not on file   Social History Narrative   She lives alone in [place]. 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     She never smoked and, and does not drink. She tries to exercise 10-15 minutes couple days a week but is limited due to lack of time.    Family History  Problem Relation Age of Onset  . Atrial fibrillation Mother   . Atrial fibrillation Father   . Stroke Maternal Grandmother   . Cancer - Lung Maternal Grandfather   . Stroke Paternal Grandmother   . Heart attack Paternal Grandfather   . Heart attack Brother     ROS- All systems are reviewed and negative except as per the HPI above  Physical Exam: Filed Vitals:   01/25/15 0923  BP: 120/76  Pulse: 73  Height: 5\' 2"  (1.575 m)  Weight: 193 lb (87.544 kg)    GEN- The patient is well appearing, alert and  oriented x 3 today.   Head- normocephalic, atraumatic Eyes-  Sclera clear, conjunctiva pink Ears- hearing intact Oropharynx- clear Neck- supple, no JVP Lymph- no cervical lymphadenopathy Lungs- Clear to ausculation bilaterally, normal work of breathing Heart- Regular rate and rhythm, no murmurs, rubs or gallops, PMI not laterally displaced GI- soft, NT, ND, + BS Extremities- no clubbing, cyanosis, or edema MS- no significant deformity or atrophy Skin- no rash or lesion Psych- euthymic mood, full affect Neuro- strength and sensation are intact  EKG- NSR with NS ST/T wave abnormalities. V rate 72 bpm, print 184 ms, qrs int 100 ms, qtc 414 ms. Dr. Jackalyn Lombard note reviewed  Assessment and Plan: 1. PAF Per Dr. Jackalyn Lombard note of 8/22, will schedule for PVI. Her questions re the procedure were answered today in detail. This will be scheduled for 12/8  She states that she has a very strong gag reflex and will require to be heavily sedated for the TEE or she will not be able to tolerate.  Xarelto 20 mg will be started  November 14 to allow for 3 weeks before procedure. She will stop asa at that time.  She will return for labs 12/1. She will continue flecainide/cardizem   2. OSA Continue cpap.  F/u afib clinic as needed.

## 2015-02-08 ENCOUNTER — Encounter: Payer: Self-pay | Admitting: Internal Medicine

## 2015-02-08 ENCOUNTER — Other Ambulatory Visit (HOSPITAL_COMMUNITY): Payer: Self-pay | Admitting: *Deleted

## 2015-02-08 DIAGNOSIS — I4819 Other persistent atrial fibrillation: Secondary | ICD-10-CM

## 2015-02-09 ENCOUNTER — Encounter (HOSPITAL_COMMUNITY): Payer: Self-pay | Admitting: *Deleted

## 2015-02-18 ENCOUNTER — Telehealth (HOSPITAL_COMMUNITY): Payer: Self-pay | Admitting: *Deleted

## 2015-02-18 NOTE — Telephone Encounter (Signed)
Patient is scheduled for afib ablation 12/8 -- is wanting to know if she will be allowed to travel to [place] by plane on 12/16 to visit family for 2 weeks. Please advise whether she will be allowed to do this post ablation.

## 2015-02-19 NOTE — Telephone Encounter (Signed)
She should be fine to travel then

## 2015-02-22 NOTE — Telephone Encounter (Signed)
Pt.notified

## 2015-02-26 ENCOUNTER — Ambulatory Visit: Payer: 59 | Admitting: Pulmonary Disease

## 2015-03-02 ENCOUNTER — Other Ambulatory Visit (HOSPITAL_COMMUNITY): Payer: Self-pay | Admitting: *Deleted

## 2015-03-02 DIAGNOSIS — I4819 Other persistent atrial fibrillation: Secondary | ICD-10-CM

## 2015-03-03 ENCOUNTER — Other Ambulatory Visit: Payer: Self-pay | Admitting: Nurse Practitioner

## 2015-03-11 ENCOUNTER — Other Ambulatory Visit (INDEPENDENT_AMBULATORY_CARE_PROVIDER_SITE_OTHER): Payer: BLUE CROSS/BLUE SHIELD | Admitting: *Deleted

## 2015-03-11 ENCOUNTER — Other Ambulatory Visit: Payer: Self-pay

## 2015-03-11 ENCOUNTER — Ambulatory Visit (HOSPITAL_COMMUNITY): Payer: BLUE CROSS/BLUE SHIELD | Attending: Internal Medicine

## 2015-03-11 DIAGNOSIS — I358 Other nonrheumatic aortic valve disorders: Secondary | ICD-10-CM | POA: Insufficient documentation

## 2015-03-11 DIAGNOSIS — I481 Persistent atrial fibrillation: Secondary | ICD-10-CM | POA: Diagnosis not present

## 2015-03-11 DIAGNOSIS — I351 Nonrheumatic aortic (valve) insufficiency: Secondary | ICD-10-CM | POA: Diagnosis not present

## 2015-03-11 DIAGNOSIS — I071 Rheumatic tricuspid insufficiency: Secondary | ICD-10-CM | POA: Diagnosis not present

## 2015-03-11 DIAGNOSIS — I4891 Unspecified atrial fibrillation: Secondary | ICD-10-CM | POA: Diagnosis present

## 2015-03-11 DIAGNOSIS — I340 Nonrheumatic mitral (valve) insufficiency: Secondary | ICD-10-CM | POA: Diagnosis not present

## 2015-03-11 DIAGNOSIS — I4819 Other persistent atrial fibrillation: Secondary | ICD-10-CM

## 2015-03-11 LAB — BASIC METABOLIC PANEL
BUN: 14 mg/dL (ref 7–25)
CALCIUM: 9.4 mg/dL (ref 8.6–10.4)
CHLORIDE: 105 mmol/L (ref 98–110)
CO2: 26 mmol/L (ref 20–31)
CREATININE: 0.89 mg/dL (ref 0.50–0.99)
Glucose, Bld: 93 mg/dL (ref 65–99)
Potassium: 3.9 mmol/L (ref 3.5–5.3)
Sodium: 138 mmol/L (ref 135–146)

## 2015-03-11 LAB — CBC WITH DIFFERENTIAL/PLATELET
BASOS ABS: 0 10*3/uL (ref 0.0–0.1)
BASOS PCT: 1 % (ref 0–1)
EOS ABS: 0.1 10*3/uL (ref 0.0–0.7)
EOS PCT: 3 % (ref 0–5)
HCT: 38 % (ref 36.0–46.0)
Hemoglobin: 12.6 g/dL (ref 12.0–15.0)
LYMPHS ABS: 1.2 10*3/uL (ref 0.7–4.0)
Lymphocytes Relative: 26 % (ref 12–46)
MCH: 29.1 pg (ref 26.0–34.0)
MCHC: 33.2 g/dL (ref 30.0–36.0)
MCV: 87.8 fL (ref 78.0–100.0)
MPV: 10.2 fL (ref 8.6–12.4)
Monocytes Absolute: 0.5 10*3/uL (ref 0.1–1.0)
Monocytes Relative: 10 % (ref 3–12)
NEUTROS PCT: 60 % (ref 43–77)
Neutro Abs: 2.9 10*3/uL (ref 1.7–7.7)
PLATELETS: 261 10*3/uL (ref 150–400)
RBC: 4.33 MIL/uL (ref 3.87–5.11)
RDW: 13 % (ref 11.5–15.5)
WBC: 4.8 10*3/uL (ref 4.0–10.5)

## 2015-03-11 NOTE — Addendum Note (Signed)
Addended by: Eulis Foster on: 03/11/2015 07:56 AM   Modules accepted: Orders

## 2015-03-11 NOTE — Addendum Note (Signed)
Addended by: Eulis Foster on: 03/11/2015 09:13 AM   Modules accepted: Orders

## 2015-03-15 ENCOUNTER — Ambulatory Visit (HOSPITAL_COMMUNITY)
Admission: RE | Admit: 2015-03-15 | Discharge: 2015-03-15 | Disposition: A | Discharge status: Home / Self Care | Payer: BLUE CROSS/BLUE SHIELD | Source: Ambulatory Visit | Attending: Internal Medicine | Admitting: Internal Medicine

## 2015-03-15 DIAGNOSIS — K760 Fatty (change of) liver, not elsewhere classified: Secondary | ICD-10-CM | POA: Insufficient documentation

## 2015-03-15 DIAGNOSIS — I4891 Unspecified atrial fibrillation: Secondary | ICD-10-CM | POA: Diagnosis not present

## 2015-03-15 DIAGNOSIS — R911 Solitary pulmonary nodule: Secondary | ICD-10-CM | POA: Insufficient documentation

## 2015-03-15 DIAGNOSIS — I481 Persistent atrial fibrillation: Secondary | ICD-10-CM | POA: Diagnosis not present

## 2015-03-15 DIAGNOSIS — I4819 Other persistent atrial fibrillation: Secondary | ICD-10-CM

## 2015-03-15 MED ORDER — NITROGLYCERIN 0.4 MG SL SUBL: 0.4000 mg | SUBLINGUAL_TABLET | SUBLINGUAL | Status: DC | PRN

## 2015-03-15 MED ORDER — NITROGLYCERIN 0.4 MG SL SUBL
SUBLINGUAL_TABLET | SUBLINGUAL | Status: AC
  Filled 2015-03-15: qty 1

## 2015-03-15 MED ORDER — IOHEXOL 350 MG/ML SOLN
80.0000 mL | Freq: Once | INTRAVENOUS | Status: AC | PRN
  Administered 2015-03-15: 100 mL via INTRAVENOUS

## 2015-03-15 MED ORDER — METOPROLOL TARTRATE 1 MG/ML IV SOLN
INTRAVENOUS | Status: AC
  Administered 2015-03-15: 2.5 mg
  Filled 2015-03-15: qty 5

## 2015-03-15 MED ORDER — NITROGLYCERIN 0.4 MG SL SUBL
0.4000 mg | SUBLINGUAL_TABLET | Freq: Once | SUBLINGUAL | Status: AC
  Administered 2015-03-15: 0.4 mg via SUBLINGUAL
  Filled 2015-03-15: qty 25

## 2015-03-15 NOTE — Progress Notes (Signed)
Pt had CT heart scan, VS stable, IV discontinued to L AC- hemostasis obtained- pt on Xarelto. Pt ate snack and RN assisted her to elevators for discharge. No dizziness or headache per pt.

## 2015-03-18 ENCOUNTER — Ambulatory Visit (HOSPITAL_COMMUNITY): Payer: BLUE CROSS/BLUE SHIELD | Admitting: Anesthesiology

## 2015-03-18 ENCOUNTER — Encounter (HOSPITAL_COMMUNITY)
Admission: RE | Disposition: A | Discharge status: Home / Self Care | Payer: Self-pay | Source: Ambulatory Visit | Attending: Internal Medicine

## 2015-03-18 ENCOUNTER — Ambulatory Visit (HOSPITAL_COMMUNITY)
Admission: RE | Admit: 2015-03-18 | Discharge: 2015-03-19 | Disposition: A | Discharge status: Home / Self Care | Payer: BLUE CROSS/BLUE SHIELD | Source: Ambulatory Visit | Attending: Internal Medicine | Admitting: Internal Medicine

## 2015-03-18 ENCOUNTER — Encounter (HOSPITAL_COMMUNITY): Payer: Self-pay | Admitting: General Practice

## 2015-03-18 DIAGNOSIS — G4730 Sleep apnea, unspecified: Secondary | ICD-10-CM | POA: Diagnosis not present

## 2015-03-18 DIAGNOSIS — E669 Obesity, unspecified: Secondary | ICD-10-CM | POA: Insufficient documentation

## 2015-03-18 DIAGNOSIS — Z79899 Other long term (current) drug therapy: Secondary | ICD-10-CM | POA: Insufficient documentation

## 2015-03-18 DIAGNOSIS — I480 Paroxysmal atrial fibrillation: Secondary | ICD-10-CM | POA: Diagnosis not present

## 2015-03-18 DIAGNOSIS — Z6834 Body mass index (BMI) 34.0-34.9, adult: Secondary | ICD-10-CM | POA: Insufficient documentation

## 2015-03-18 DIAGNOSIS — E785 Hyperlipidemia, unspecified: Secondary | ICD-10-CM | POA: Insufficient documentation

## 2015-03-18 DIAGNOSIS — Z7982 Long term (current) use of aspirin: Secondary | ICD-10-CM | POA: Insufficient documentation

## 2015-03-18 DIAGNOSIS — I483 Typical atrial flutter: Secondary | ICD-10-CM | POA: Diagnosis not present

## 2015-03-18 DIAGNOSIS — Z880 Allergy status to penicillin: Secondary | ICD-10-CM | POA: Insufficient documentation

## 2015-03-18 DIAGNOSIS — I4892 Unspecified atrial flutter: Secondary | ICD-10-CM | POA: Diagnosis present

## 2015-03-18 DIAGNOSIS — I4821 Permanent atrial fibrillation: Secondary | ICD-10-CM | POA: Diagnosis present

## 2015-03-18 HISTORY — DX: Unspecified osteoarthritis, unspecified site: M19.90

## 2015-03-18 HISTORY — PX: ATRIAL FIBRILLATION ABLATION: SHX5732

## 2015-03-18 HISTORY — PX: ELECTROPHYSIOLOGIC STUDY: SHX172A

## 2015-03-18 HISTORY — DX: Obstructive sleep apnea (adult) (pediatric): G47.33

## 2015-03-18 HISTORY — DX: Obstructive sleep apnea (adult) (pediatric): Z99.89

## 2015-03-18 LAB — POCT ACTIVATED CLOTTING TIME
ACTIVATED CLOTTING TIME: 312 s
ACTIVATED CLOTTING TIME: 167 s
Activated Clotting Time: 327 seconds

## 2015-03-18 LAB — MRSA PCR SCREENING: MRSA by PCR: NEGATIVE

## 2015-03-18 SURGERY — ATRIAL FIBRILLATION ABLATION
Anesthesia: General

## 2015-03-18 SURGERY — ECHOCARDIOGRAM, TRANSESOPHAGEAL
Anesthesia: Moderate Sedation

## 2015-03-18 MED ORDER — SODIUM CHLORIDE 0.9 % IJ SOLN: 3.0000 mL | INTRAMUSCULAR | Status: DC | PRN

## 2015-03-18 MED ORDER — PHENYLEPHRINE HCL 10 MG/ML IJ SOLN
10.0000 mg | INTRAVENOUS | Status: DC | PRN
  Administered 2015-03-18: 10 ug/min via INTRAVENOUS

## 2015-03-18 MED ORDER — SODIUM CHLORIDE 0.9 % IJ SOLN
3.0000 mL | Freq: Two times a day (BID) | INTRAMUSCULAR | Status: DC
  Administered 2015-03-19: 3 mL via INTRAVENOUS

## 2015-03-18 MED ORDER — HEPARIN SODIUM (PORCINE) 1000 UNIT/ML IJ SOLN
INTRAMUSCULAR | Status: DC | PRN
  Administered 2015-03-18 (×2): 1000 [IU] via INTRAVENOUS

## 2015-03-18 MED ORDER — IOHEXOL 350 MG/ML SOLN
INTRAVENOUS | Status: DC | PRN
  Administered 2015-03-18: 2 mL via INTRAVENOUS

## 2015-03-18 MED ORDER — FLUOXETINE HCL 20 MG PO TABS
20.0000 mg | ORAL_TABLET | Freq: Every day | ORAL | Status: DC
  Filled 2015-03-18 (×2): qty 1

## 2015-03-18 MED ORDER — PROTAMINE SULFATE 10 MG/ML IV SOLN
INTRAVENOUS | Status: DC | PRN
  Administered 2015-03-18: 20 mg via INTRAVENOUS
  Administered 2015-03-18: 10 mg via INTRAVENOUS

## 2015-03-18 MED ORDER — PANTOPRAZOLE SODIUM 40 MG PO TBEC
40.0000 mg | DELAYED_RELEASE_TABLET | Freq: Every day | ORAL | Status: DC
  Administered 2015-03-18 – 2015-03-19 (×2): 40 mg via ORAL
  Filled 2015-03-18 (×2): qty 1

## 2015-03-18 MED ORDER — RIVAROXABAN 20 MG PO TABS
20.0000 mg | ORAL_TABLET | Freq: Every day | ORAL | Status: DC
  Administered 2015-03-18: 20 mg via ORAL
  Filled 2015-03-18: qty 1

## 2015-03-18 MED ORDER — ACETAMINOPHEN 325 MG PO TABS: 650.0000 mg | ORAL_TABLET | ORAL | Status: DC | PRN

## 2015-03-18 MED ORDER — BUPIVACAINE HCL (PF) 0.25 % IJ SOLN
INTRAMUSCULAR | Status: DC | PRN
  Administered 2015-03-18: 5 mL

## 2015-03-18 MED ORDER — BUPIVACAINE HCL (PF) 0.25 % IJ SOLN
INTRAMUSCULAR | Status: AC
  Filled 2015-03-18: qty 30

## 2015-03-18 MED ORDER — ONDANSETRON HCL 4 MG/2ML IJ SOLN: 4.0000 mg | Freq: Four times a day (QID) | INTRAMUSCULAR | Status: DC | PRN

## 2015-03-18 MED ORDER — SODIUM CHLORIDE 0.9 % IV SOLN: 250.0000 mL | INTRAVENOUS | Status: DC | PRN

## 2015-03-18 MED ORDER — HEPARIN SODIUM (PORCINE) 1000 UNIT/ML IJ SOLN
INTRAMUSCULAR | Status: DC | PRN
  Administered 2015-03-18: 1000 [IU] via INTRAVENOUS

## 2015-03-18 MED ORDER — LACTATED RINGERS IV SOLN
INTRAVENOUS | Status: DC | PRN
  Administered 2015-03-18 (×2): via INTRAVENOUS

## 2015-03-18 MED ORDER — HEPARIN SODIUM (PORCINE) 1000 UNIT/ML IJ SOLN
INTRAMUSCULAR | Status: DC | PRN
  Administered 2015-03-18: 12000 [IU] via INTRAVENOUS

## 2015-03-18 MED ORDER — FENTANYL CITRATE (PF) 100 MCG/2ML IJ SOLN
INTRAMUSCULAR | Status: DC | PRN
  Administered 2015-03-18: 50 ug via INTRAVENOUS
  Administered 2015-03-18: 100 ug via INTRAVENOUS

## 2015-03-18 MED ORDER — DOBUTAMINE IN D5W 4-5 MG/ML-% IV SOLN
INTRAVENOUS | Status: AC
  Filled 2015-03-18: qty 250

## 2015-03-18 MED ORDER — DOBUTAMINE IN D5W 4-5 MG/ML-% IV SOLN
INTRAVENOUS | Status: DC | PRN
  Administered 2015-03-18: 20 ug/kg/min via INTRAVENOUS

## 2015-03-18 MED ORDER — HEPARIN SODIUM (PORCINE) 1000 UNIT/ML IJ SOLN: INTRAMUSCULAR | Status: DC | PRN

## 2015-03-18 MED ORDER — LIDOCAINE HCL (CARDIAC) 20 MG/ML IV SOLN
INTRAVENOUS | Status: DC | PRN
  Administered 2015-03-18: 100 mg via INTRAVENOUS

## 2015-03-18 MED ORDER — MIDAZOLAM HCL 5 MG/5ML IJ SOLN
INTRAMUSCULAR | Status: DC | PRN
  Administered 2015-03-18: 2 mg via INTRAVENOUS

## 2015-03-18 MED ORDER — PROPOFOL 10 MG/ML IV BOLUS
INTRAVENOUS | Status: DC | PRN
  Administered 2015-03-18: 200 mg via INTRAVENOUS

## 2015-03-18 MED ORDER — HYDROCODONE-ACETAMINOPHEN 5-325 MG PO TABS: 1.0000 | ORAL_TABLET | ORAL | Status: DC | PRN

## 2015-03-18 MED ORDER — HEPARIN SODIUM (PORCINE) 1000 UNIT/ML IJ SOLN
INTRAMUSCULAR | Status: AC
  Filled 2015-03-18: qty 1

## 2015-03-18 SURGICAL SUPPLY — 19 items
BAG SNAP BAND KOVER 36X36 (MISCELLANEOUS) ×2 IMPLANT
BLANKET WARM UNDERBOD FULL ACC (MISCELLANEOUS) ×2 IMPLANT
CATH NAVISTAR SMARTTOUCH DF (ABLATOR) ×2 IMPLANT
CATH SOUNDSTAR ECO REPROCESSED (CATHETERS) ×1 IMPLANT
CATH VARIABLE LASSO NAV 2515 (CATHETERS) ×1 IMPLANT
CATH WEBSTER BI DIR CS D-F CRV (CATHETERS) ×2 IMPLANT
COVER SWIFTLINK CONNECTOR (BAG) ×2 IMPLANT
NDL TRANSEP BRK 71CM 407200 (NEEDLE) ×1 IMPLANT
NEEDLE TRANSEP BRK 71CM 407200 (NEEDLE) ×2 IMPLANT
PACK EP LATEX FREE (CUSTOM PROCEDURE TRAY) ×2
PACK EP LF (CUSTOM PROCEDURE TRAY) ×1 IMPLANT
PAD DEFIB LIFELINK (PAD) ×2 IMPLANT
PATCH CARTO3 (PAD) ×2 IMPLANT
SHEATH AVANTI 11F 11CM (SHEATH) ×2 IMPLANT
SHEATH PINNACLE 7F 10CM (SHEATH) ×3 IMPLANT
SHEATH PINNACLE 9F 10CM (SHEATH) ×2 IMPLANT
SHEATH SWARTZ TS SL2 63CM 8.5F (SHEATH) ×2 IMPLANT
SHIELD RADPAD SCOOP 12X17 (MISCELLANEOUS) ×2 IMPLANT
TUBING SMART ABLATE COOLFLOW (TUBING) ×2 IMPLANT

## 2015-03-18 NOTE — Transfer of Care (Signed)
Immediate Anesthesia Transfer of Care Note  Patient: Robin [name]  Procedure(s) Performed: Procedure(s): Atrial Fibrillation Ablation (N/A)  Patient Location: Cath Lab  Anesthesia Type:General  Level of Consciousness: awake, alert  and oriented  Airway & Oxygen Therapy: Patient Spontanous Breathing and Patient connected to nasal cannula oxygen  Post-op Assessment: Report given to RN and Post -op Vital signs reviewed and stable  Post vital signs: Reviewed and stable  Last Vitals:  Filed Vitals:   03/18/15 0948  BP: 144/81  Pulse: 60  Temp: 36.6 C  Resp: 18    Complications: No apparent anesthesia complications

## 2015-03-18 NOTE — Progress Notes (Signed)
Site area: Right groin a 9, and 7 X2 venous sheath was removed  Site Prior to Removal:  Level 0  Pressure Applied For 15 MINUTES    Minutes Beginning at 1455p  Manual:   Yes.    Patient Status During Pull:  stable  Post Pull Groin Site:  Level 0  Post Pull Instructions Given:  Yes.    Post Pull Pulses Present:  Yes.    Dressing Applied:  Yes.    Comments:  VS remain stable during sheath pull.

## 2015-03-18 NOTE — Anesthesia Procedure Notes (Signed)
Procedure Name: LMA Insertion Date/Time: 03/18/2015 11:08 AM Performed by: Mariea Clonts Pre-anesthesia Checklist: Patient identified, Timeout performed, Emergency Drugs available, Patient being monitored and Suction available Patient Re-evaluated:Patient Re-evaluated prior to inductionOxygen Delivery Method: Circle system utilized Preoxygenation: Pre-oxygenation with 100% oxygen Intubation Type: IV induction LMA: LMA inserted LMA Size: 4.0 Placement Confirmation: breath sounds checked- equal and bilateral and positive ETCO2 Tube secured with: Tape Dental Injury: Teeth and Oropharynx as per pre-operative assessment

## 2015-03-18 NOTE — Progress Notes (Signed)
Placed pt. On cpap. Pt. Tolerating well at this time. 

## 2015-03-18 NOTE — H&P (Signed)
PCP: Tawanna Solo, MD Primary Cardiologist: Ellyn Hack  The patient presents today for afib ablation. She is doing well.  She continues to have afib.  Today, she denies symptoms of palpitations, chest pain, shortness of breath, orthopnea, PND, lower extremity edema, dizziness, presyncope, syncope, or neurologic sequela. The patient feels that she is tolerating medications without difficulties and is otherwise without complaint today.   Past Medical History  Diagnosis Date  . Hyperlipemia   . Menopause   . Obesity (BMI 30.0-34.9)   . GERD (gastroesophageal reflux disease)   . PAF (paroxysmal atrial fibrillation) 01/2013    With RVR --> Myoview 02/19/2013: No ischemia/Infarct, EF 74%;; Echo 02/19/2013 : Normal EF [id number], G1 diastolic dysfxn.  . Atrial flutter 01/2013  . Sinusitis   . Obesity    Past Surgical History  Procedure Laterality Date  . Carpal tunnel release  2009    both hands  . Tonsillectomy  1960  . Transthoracic echocardiogram  November 2014    EF 60-65%. Normal LV size, thickness and function. Gr1 DD. No valvular lesions.   . Nm myoview ltd  November 2014    LOW RISK study, EF 74%. Mild attenuation artifact.    Current Outpatient Prescriptions  Medication Sig Dispense Refill  . aspirin 81 MG tablet Take 81 mg by mouth daily.    . calcium citrate-vitamin D 500-400 MG-UNIT chewable tablet Chew 1 tablet by mouth daily.    . cholecalciferol (VITAMIN D) 1000 UNITS tablet Take 1,000 Units by mouth daily.    Marland Kitchen diltiazem (CARTIA XT) 120 MG 24 hr capsule Take 1 capsule (120 mg total) by mouth daily. 90 capsule 3  . fish oil-omega-3 fatty acids 1000 MG capsule Take 1 g by mouth daily.    . flecainide (TAMBOCOR) 100 MG tablet TAKE 1 BY MOUTH TWICE DAILY. 180 tablet 3  . FLUoxetine (PROZAC) 20 MG tablet Take 1 tablet by mouth daily.    . Multiple Vitamin (MULTIVITAMIN  WITH MINERALS) TABS tablet Take 1 tablet by mouth daily.    Marland Kitchen ofloxacin (OCUFLOX) 0.3 % ophthalmic solution Place 1 drop into the right eye 2 (two) times daily.  1  . omeprazole (PRILOSEC) 20 MG capsule Take 1 tablet every day    . PROLENSA 0.07 % SOLN Place 1 drop into the right eye as directed.  1   No current facility-administered medications for this visit.    Allergies  Allergen Reactions  . Penicillins Hives    Social History   Social History  . Marital Status: Single    Spouse Name: N/A  . Number of Children: N/A  . Years of Education: N/A   Occupational History  . accountant    Social History Main Topics  . Smoking status: Never Smoker   . Smokeless tobacco: Never Used  . Alcohol Use: No  . Drug Use: No  . Sexual Activity: Not on file   Other Topics Concern  . Not on file   Social History Narrative   She lives alone in [place]. 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    She never smoked and, and does not drink. She tries to exercise 10-15 minutes couple days a week but is limited due to lack of time.    Family History  Problem Relation Age of Onset  . Atrial fibrillation Mother   . Atrial fibrillation Father   . Stroke Maternal Grandmother   . Cancer - Lung Maternal Grandfather   .  Stroke Paternal Grandmother   . Heart attack Paternal Grandfather   . Heart attack Brother     ROS- All systems are reviewed and are negative except as outlined in the HPI above  Physical Exam:                   Filed Vitals:   03/18/15 0948  BP: 144/81  Pulse: 60  Temp: 97.8 F (36.6 C)  Resp: 18   GEN- The patient is well appearing, alert and oriented x 3 today.  Head- normocephalic, atraumatic Eyes- Sclera clear, conjunctiva pink Ears- hearing intact Oropharynx- clear Neck- supple,   Lungs- Clear to ausculation bilaterally, normal work of breathing Heart- Regular rate and rhythm, no murmurs, rubs or gallops, PMI not laterally displaced GI- soft, NT, ND, + BS Extremities- no clubbing, cyanosis, or edema MS- no significant deformity or atrophy Skin- no rash or lesion Psych- euthymic mood, full affect Neuro- strength and sensation are intact   Assessment and Plan:  1. Afib/ atrial flutter She has failed medical therapy with diltiazem and flecainide She reports vagal triggers such as heavy meals, indigestion, or late evenings as triggers. Therapeutic strategies for afib and atrial flutter including medicine and ablation were discussed in detail with the patient today. Risk, benefits, and alternatives to EP study and radiofrequency ablation were also discussed in detail today. These risks include but are not limited to stroke, bleeding, vascular damage, tamponade, perforation, damage to the esophagus, lungs, and other structures, pulmonary vein stenosis, worsening renal function, and death. The patient understands these risk and wishes to proceed.  She reports compliance with xarelto.  Cardiac CT is reviewed with her today.  Reveals no thrombus.  I have encouraged her to have pulmonary nodule evaluated with repeat CT in 3 months  Thompson Grayer MD, [facility] 03/18/2015 11:02 AM

## 2015-03-18 NOTE — Discharge Instructions (Signed)

## 2015-03-18 NOTE — Progress Notes (Signed)
Pt arrived to unit from cath lab. VSS, a&ox4 with no pain or respiratory distress. R groin site level 0. Diet ordered and pt willing to eat. Family at bedside. Will continue to monitor pt closely. Leanne Chang, RN

## 2015-03-18 NOTE — Anesthesia Postprocedure Evaluation (Signed)
Anesthesia Post Note  Patient: Robin [name]  Procedure(s) Performed: Procedure(s) (LRB): Atrial Fibrillation Ablation (N/A)  Patient location during evaluation: PACU Anesthesia Type: General Level of consciousness: awake and alert Pain management: pain level controlled Vital Signs Assessment: post-procedure vital signs reviewed and stable Respiratory status: spontaneous breathing, nonlabored ventilation, respiratory function stable and patient connected to nasal cannula oxygen Cardiovascular status: blood pressure returned to baseline and stable Postop Assessment: no signs of nausea or vomiting Anesthetic complications: no    Last Vitals:  Filed Vitals:   03/18/15 1440 03/18/15 1445  BP: 117/70 121/71  Pulse: 59 61  Temp:    Resp: 14 17    Last Pain: There were no vitals filed for this visit.               [place]

## 2015-03-18 NOTE — Anesthesia Preprocedure Evaluation (Signed)
Anesthesia Evaluation  Patient identified by MRN, date of birth, ID band Patient awake    Reviewed: Allergy & Precautions, NPO status , Patient's Chart, lab work & pertinent test results  Airway Mallampati: I  TM Distance: >3 FB Neck ROM: Full    Dental   Pulmonary sleep apnea ,    Pulmonary exam normal        Cardiovascular Normal cardiovascular exam     Neuro/Psych    GI/Hepatic GERD  Medicated and Controlled,  Endo/Other    Renal/GU      Musculoskeletal   Abdominal   Peds  Hematology   Anesthesia Other Findings   Reproductive/Obstetrics                             Anesthesia Physical Anesthesia Plan  ASA: III  Anesthesia Plan: General   Post-op Pain Management:    Induction: Intravenous  Airway Management Planned: LMA  Additional Equipment:   Intra-op Plan:   Post-operative Plan: Extubation in OR  Informed Consent: I have reviewed the patients History and Physical, chart, labs and discussed the procedure including the risks, benefits and alternatives for the proposed anesthesia with the patient or authorized representative who has indicated his/her understanding and acceptance.     Plan Discussed with: CRNA and Surgeon  Anesthesia Plan Comments:         Anesthesia Quick Evaluation

## 2015-03-18 NOTE — Progress Notes (Signed)
Md paged for cpap orders per pt request.  Uses home cpap at [facility].  Will continue to monitor. Saunders Revel T

## 2015-03-19 ENCOUNTER — Encounter (HOSPITAL_COMMUNITY): Payer: Self-pay | Admitting: Internal Medicine

## 2015-03-19 DIAGNOSIS — I480 Paroxysmal atrial fibrillation: Secondary | ICD-10-CM

## 2015-03-19 DIAGNOSIS — I483 Typical atrial flutter: Secondary | ICD-10-CM | POA: Diagnosis not present

## 2015-03-19 DIAGNOSIS — E785 Hyperlipidemia, unspecified: Secondary | ICD-10-CM | POA: Diagnosis not present

## 2015-03-19 DIAGNOSIS — E669 Obesity, unspecified: Secondary | ICD-10-CM | POA: Diagnosis not present

## 2015-03-19 NOTE — Discharge Summary (Signed)
ELECTROPHYSIOLOGY PROCEDURE DISCHARGE SUMMARY    Patient ID: Robin [name],  MRN: [id number], DOB/AGE: [id number] 62 y.o.  Admit date: 03/18/2015 Discharge date: 03/19/2015  Primary Care Physician: Tawanna Solo, MD Primary Cardiologist: Dr. Ellyn Hack Electrophysiologist: Thompson Grayer, MD  Primary Discharge Diagnosis:  Paroxysmal atrial fibrillation Typical atrial Flutter  Secondary Discharge Diagnosis:  1. HLD 2. obesity  Procedures This Admission:  1.  Electrophysiology study and radiofrequency catheter ablation on 03/18/15 by Dr Thompson Grayer.  This study demonstrated  CONCLUSIONS: 1. Sinus rhythm upon presentation.  2. Successful electrical isolation and anatomical encircling of all faive pulmonary veins with radiofrequency current (there was a right middle PV also noted).  3. Cavo-tricuspid isthmus ablation was performed with complete bidirectional isthmus block achieved.  4. Additional ablation performed at the RA/SVC junction 5. No inducible arrhythmias following ablation both on and off of dobutamine 6. No early apparent complications.  Brief HPI: Robin [name] is a 64 y.o. female with a history of paroxysmal atrial fibrillation.  They have failed medical therapy with diltiazem and flecainide. Risks, benefits, and alternatives to catheter ablation of atrial fibrillation were reviewed with the patient who wished to proceed.  The patient underwent CT scan prior to the procedure which demonstrated no LAA thrombus.    Hospital Course:  The patient was admitted and underwent EPS/RFCA of atrial fibrillation with details as outlined above.  They were monitored on telemetry overnight which demonstrated sinus rhythm.  Groin was without complication on the day of discharge.  The patient was examined and considered to be stable for discharge.  Wound care and restrictions were reviewed with the patient.  The patient will be seen back by Roderic Palau, NP in 4 weeks and Dr  Rayann Heman in 12 weeks for post ablation follow up. The patient was as well instructed to see her PMD and/or seek pulmonary evaluation for finding of pulmonary nodule on her CT scan.  This patients CHA2DS2-VASc Score is 1, currently on Xarelto started October, no missed dose, and will need to continue post ablation uninterrupted until follow-up for further evaluation/recommendations.   Physical Exam: Filed Vitals:   03/19/15 0400 03/19/15 0425 03/19/15 0731 03/19/15 0844  BP: 111/62     Pulse: 64     Temp:  98 F (36.7 C)  97.4 F (36.3 C)  TempSrc:  Oral  Oral  Resp: 16     Height:      Weight:      SpO2: 93%  98%     GEN- The patient is well appearing, alert and oriented x 3 today.   HEENT: normocephalic, atraumatic; sclera clear, conjunctiva pink; hearing intact; oropharynx clear; neck supple  Lungs- Clear to ausculation bilaterally, normal work of breathing.  No wheezes, rales, rhonchi Heart- Regular rate and rhythm, no murmurs, rubs or gallops  GI- soft, non-tender, non-distended, bowel sounds present  Extremities- no clubbing, cyanosis, or edema; DP/PT/radial pulses 2+ bilaterally, groin without hematoma/bruit MS- no significant deformity or atrophy Skin- warm and dry, no rash or lesion Psych- euthymic mood, full affect Neuro- strength and sensation are intact   Labs:   Lab Results  Component Value Date   WBC 4.8 03/11/2015   HGB 12.6 03/11/2015   HCT 38.0 03/11/2015   MCV 87.8 03/11/2015   PLT 261 03/11/2015   No results for input(s): NA, K, CL, CO2, BUN, CREATININE, CALCIUM, PROT, BILITOT, ALKPHOS, ALT, AST, GLUCOSE in the last 168 hours.  Invalid input(s): LABALBU   Discharge Medications:  Medication List    TAKE these medications        calcium citrate-vitamin D 500-400 MG-UNIT chewable tablet  Chew 1 tablet by mouth daily.     cholecalciferol 1000 UNITS tablet  Commonly known as:  VITAMIN D  Take 1,000 Units by mouth daily.     diltiazem 120 MG 24  hr capsule  Commonly known as:  CARTIA XT  Take 1 capsule (120 mg total) by mouth daily.     fish oil-omega-3 fatty acids 1000 MG capsule  Take 1 g by mouth daily.     flecainide 100 MG tablet  Commonly known as:  TAMBOCOR  TAKE 1 BY MOUTH TWICE DAILY.     FLUoxetine 20 MG tablet  Commonly known as:  PROZAC  Take 20 mg by mouth daily.     multivitamin with minerals Tabs tablet  Take 1 tablet by mouth daily.     omeprazole 20 MG capsule  Commonly known as:  PRILOSEC  Take 20 mg by mouth daily.     rivaroxaban 20 MG Tabs tablet  Commonly known as:  XARELTO  Take 1 tablet (20 mg total) by mouth daily with supper.        Disposition:   Follow-up Information    Follow up with [facility] On 04/14/2015.   Specialty:  Cardiology   Why:  10:00AM   Contact information:   [address] [id number] [place] [phone]      Follow up with Thompson Grayer, MD On 06/18/2015.   Specialty:  Cardiology   Why:  11:00AM   Contact information:   [place] [id number] [phone]       Duration of Discharge Encounter: Greater than 30 minutes including physician time.  Signed, Jodelle Green 03/19/2015 9:15 AM  I have seen, examined the patient, and reviewed the above assessment and plan.  On exam, RRR. No hematoma/ bruit.  Changes to above are made where necessary.    Co Sign: Thompson Grayer, MD 03/19/2015 9:15 AM

## 2015-03-19 NOTE — Progress Notes (Signed)
Pt education complete. Pt denies any further questions, comments or concerns.  Pt wheeled out to family members car.  Emotional support given.

## 2015-04-14 ENCOUNTER — Inpatient Hospital Stay (HOSPITAL_COMMUNITY): Admit: 2015-04-14 | Payer: BLUE CROSS/BLUE SHIELD | Admitting: Nurse Practitioner

## 2015-04-20 ENCOUNTER — Encounter (HOSPITAL_COMMUNITY): Payer: Self-pay | Admitting: Nurse Practitioner

## 2015-04-20 ENCOUNTER — Ambulatory Visit (HOSPITAL_COMMUNITY)
Admission: RE | Admit: 2015-04-20 | Discharge: 2015-04-20 | Disposition: A | Discharge status: Home / Self Care | Payer: BLUE CROSS/BLUE SHIELD | Source: Ambulatory Visit | Attending: Nurse Practitioner | Admitting: Nurse Practitioner

## 2015-04-20 VITALS — BP 108/78 | HR 76 | Ht 62.0 in | Wt 187.4 lb

## 2015-04-20 DIAGNOSIS — I481 Persistent atrial fibrillation: Secondary | ICD-10-CM | POA: Diagnosis not present

## 2015-04-20 DIAGNOSIS — R938 Abnormal findings on diagnostic imaging of other specified body structures: Secondary | ICD-10-CM | POA: Insufficient documentation

## 2015-04-20 DIAGNOSIS — G4733 Obstructive sleep apnea (adult) (pediatric): Secondary | ICD-10-CM | POA: Diagnosis not present

## 2015-04-20 DIAGNOSIS — I4819 Other persistent atrial fibrillation: Secondary | ICD-10-CM

## 2015-04-20 NOTE — Progress Notes (Signed)
Patient ID: Robin [name], female   DOB: [birth year], 65 y.o.   MRN: [id number]     Primary Care Physician: Tawanna Solo, MD Referring Physician: Dr. Charlies Constable is a 65 y.o. female with a h/o persistent afib that is here for f/u of ablation 12/8. She denies any dysphagia or rt groin pain. She has had some congestion and coughing. Flew to [place] shortly after the procedure and started coughing then. It is a loose cough, more noticeable right after trying to lie down at night. No unusual shortness of breath. Cough is improving. She had a small nodule on chest CT that will need repeating in 3 months per recommendations of radiology. Using cpap. She had a small of amount of afib in first few weeks following ablation, but none recently. Follows up in pulmonology in February for cpap compliance. Continues on flecainide 50 mg bid, xarelto.  Today, she denies symptoms of palpitations, chest pain, shortness of breath, orthopnea, PND, lower extremity edema, dizziness, presyncope, syncope, or neurologic sequela. The patient is tolerating medications without difficulties and is otherwise without complaint today.   Past Medical History  Diagnosis Date  . Hyperlipemia   . Menopause   . Obesity (BMI 30.0-34.9)   . GERD (gastroesophageal reflux disease)   . PAF (paroxysmal atrial fibrillation) ([facility]) 01/2013    With RVR --> Myoview 02/19/2013: No ischemia/Infarct, EF 74%;; Echo 02/19/2013 : Normal EF [id number], G1 diastolic dysfxn.  . Atrial flutter ([facility]) 01/2013  . Sinusitis   . Obesity   . OSA on CPAP   . Arthritis     "gentle; in my hands" (03/18/2015)   Past Surgical History  Procedure Laterality Date  . Carpal tunnel release Bilateral 2009  . Tonsillectomy  1960  . Transthoracic echocardiogram  November 2014    EF 60-65%. Normal LV size, thickness and function. Gr1 DD. No valvular lesions.   . Nm myoview ltd  November 2014    LOW RISK study, EF 74%. Mild attenuation artifact.    . Cataract extraction w/ intraocular lens  implant, bilateral Bilateral 11/2014 - 12/2014  . Atrial fibrillation ablation  03/18/2015  . Electrophysiologic study N/A 03/18/2015    Procedure: Atrial Fibrillation Ablation;  Surgeon: Thompson Grayer, MD;  Location: [facility];  Service: Cardiovascular;  Laterality: N/A;    Current Outpatient Prescriptions  Medication Sig Dispense Refill  . calcium citrate-vitamin D 500-400 MG-UNIT chewable tablet Chew 1 tablet by mouth daily.    . cholecalciferol (VITAMIN D) 1000 UNITS tablet Take 1,000 Units by mouth daily.    Marland Kitchen diltiazem (CARTIA XT) 120 MG 24 hr capsule Take 1 capsule (120 mg total) by mouth daily. 90 capsule 3  . fish oil-omega-3 fatty acids 1000 MG capsule Take 1 g by mouth daily.    . flecainide (TAMBOCOR) 100 MG tablet TAKE 1 BY MOUTH TWICE DAILY. (Patient taking differently: Take 100 mg by mouth 2 (two) times daily. Take half in the morning and the other half in the evening.) 180 tablet 3  . FLUoxetine (PROZAC) 20 MG tablet Take 20 mg by mouth daily.     . Multiple Vitamin (MULTIVITAMIN WITH MINERALS) TABS tablet Take 1 tablet by mouth daily.    Marland Kitchen omeprazole (PRILOSEC) 20 MG capsule Take 20 mg by mouth daily.     . rivaroxaban (XARELTO) 20 MG TABS tablet Take 1 tablet (20 mg total) by mouth daily with supper. 90 tablet 3   No current facility-administered medications for  this encounter.    Allergies  Allergen Reactions  . Penicillins Hives    Social History   Social History  . Marital Status: Single    Spouse Name: N/A  . Number of Children: N/A  . Years of Education: N/A   Occupational History  . accountant    Social History Main Topics  . Smoking status: Never Smoker   . Smokeless tobacco: Never Used  . Alcohol Use: No  . Drug Use: No  . Sexual Activity: No   Other Topics Concern  . Not on file   Social History Narrative   She lives alone in [place]. 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     She never smoked and, and does not drink. She tries to exercise 10-15 minutes couple days a week but is limited due to lack of time.    Family History  Problem Relation Age of Onset  . Atrial fibrillation Mother   . Atrial fibrillation Father   . Stroke Maternal Grandmother   . Cancer - Lung Maternal Grandfather   . Stroke Paternal Grandmother   . Heart attack Paternal Grandfather   . Heart attack Brother     ROS- All systems are reviewed and negative except as per the HPI above  Physical Exam: Filed Vitals:   04/20/15 0840  BP: 108/78  Pulse: 76  Height: 5\' 2"  (1.575 m)  Weight: 187 lb 6.4 oz (85.004 kg)    GEN- The patient is well appearing, alert and oriented x 3 today.   Head- normocephalic, atraumatic Eyes-  Sclera clear, conjunctiva pink Ears- hearing intact Oropharynx- clear Neck- supple, no JVP Lymph- no cervical lymphadenopathy Lungs- Clear to ausculation bilaterally, normal work of breathing Heart- Regular rate and rhythm, no murmurs, rubs or gallops, PMI not laterally displaced GI- soft, NT, ND, + BS Extremities- no clubbing, cyanosis, or edema MS- no significant deformity or atrophy Skin- no rash or lesion Psych- euthymic mood, full affect Neuro- strength and sensation are intact  EKG- NSR, at 76 bpm, qrs int 94 ms, qtc 420 ms.  Epic records reviewed  Assessment and Plan: 1. Persistent afib Doing well s/p cardioversion Cough, which is improving, possible URI, can use muccinex or robitussin plain Continue flecainide and xarelto  2. Abnormal CT 1.2 x 1.5  sub solid nodule on pre ablation chest ct Will repeat as per radiologist recommendation in 3 months Will be scheduled in early March for review when she has f/u March 10 with Dr. Rayann Heman.  3. OSA Continue cpap F/u with Dr. Halford Chessman for compliance as scheduled 2/15.  Geroge Baseman Norine Reddington, [facility] [address] [place], [place]  [address] [phone]

## 2015-04-23 ENCOUNTER — Encounter: Payer: Self-pay | Admitting: Internal Medicine

## 2015-04-30 ENCOUNTER — Ambulatory Visit: Payer: BLUE CROSS/BLUE SHIELD | Admitting: Internal Medicine

## 2015-05-20 ENCOUNTER — Telehealth (HOSPITAL_COMMUNITY): Payer: Self-pay | Admitting: *Deleted

## 2015-05-20 NOTE — Telephone Encounter (Signed)
PA for chest Ct [id number] valid 05/21/2015-06/18/2015

## 2015-05-26 ENCOUNTER — Encounter: Payer: Self-pay | Admitting: Pulmonary Disease

## 2015-05-26 ENCOUNTER — Ambulatory Visit (INDEPENDENT_AMBULATORY_CARE_PROVIDER_SITE_OTHER)
Admission: RE | Admit: 2015-05-26 | Discharge: 2015-05-26 | Disposition: A | Discharge status: Home / Self Care | Payer: BLUE CROSS/BLUE SHIELD | Source: Ambulatory Visit | Attending: Pulmonary Disease | Admitting: Pulmonary Disease

## 2015-05-26 ENCOUNTER — Ambulatory Visit (INDEPENDENT_AMBULATORY_CARE_PROVIDER_SITE_OTHER): Payer: BLUE CROSS/BLUE SHIELD | Admitting: Pulmonary Disease

## 2015-05-26 VITALS — BP 120/76 | HR 74

## 2015-05-26 DIAGNOSIS — R911 Solitary pulmonary nodule: Secondary | ICD-10-CM | POA: Diagnosis not present

## 2015-05-26 DIAGNOSIS — R05 Cough: Secondary | ICD-10-CM

## 2015-05-26 DIAGNOSIS — Z9989 Dependence on other enabling machines and devices: Secondary | ICD-10-CM

## 2015-05-26 DIAGNOSIS — G4733 Obstructive sleep apnea (adult) (pediatric): Secondary | ICD-10-CM | POA: Diagnosis not present

## 2015-05-26 DIAGNOSIS — R053 Chronic cough: Secondary | ICD-10-CM

## 2015-05-26 NOTE — Patient Instructions (Addendum)
Use nasacort two sprays each nostril nightly for at least 3 weeks  Will get chest xray today  Will get copy of CPAP download  Follow up in 6 to 8 weeks

## 2015-05-26 NOTE — Progress Notes (Signed)
Current Outpatient Prescriptions on File Prior to Visit  Medication Sig  . calcium citrate-vitamin D 500-400 MG-UNIT chewable tablet Chew 1 tablet by mouth daily.  . cholecalciferol (VITAMIN D) 1000 UNITS tablet Take 1,000 Units by mouth daily.  Marland Kitchen diltiazem (CARTIA XT) 120 MG 24 hr capsule Take 1 capsule (120 mg total) by mouth daily.  . fish oil-omega-3 fatty acids 1000 MG capsule Take 1 g by mouth daily.  . flecainide (TAMBOCOR) 100 MG tablet TAKE 1 BY MOUTH TWICE DAILY. (Patient taking differently: Take 50 mg by mouth 2 (two) times daily. Take half in the morning and the other half in the evening.)  . Multiple Vitamin (MULTIVITAMIN WITH MINERALS) TABS tablet Take 1 tablet by mouth daily.  . rivaroxaban (XARELTO) 20 MG TABS tablet Take 1 tablet (20 mg total) by mouth daily with supper.   No current facility-administered medications on file prior to visit.     Chief Complaint  Patient presents with  . Follow-up    pt. states she wears CPAP 7 hr. everynight. feels pressure is good. supplies needed (pillow) DME:APS     Tests PSG 1//1615 >> AHI 32, SpO2 low 71% Echo 03/11/15 >> EF 60 to 123456, grade 1 diastolic dysfx, mild MR Cardiac CT 03/15/15 >>  1.5 cm GGO Rt lower lung  Past medical hx PAF, HLD, GERD, Arthritis.  Past surgical hx, Allergies, Family hx, Social hx all reviewed.  Vital Signs BP 120/76 mmHg  Pulse 74  SpO2 95%  History of Present Illness Robin [name] is a 65 y.o. female with OSA.  She has been using CPAP w/o difficulty.  She does not have any trouble with her mask fit.  She has been getting cough for past several months.  She has nasal congestion and post-nasal drip.  She was tried on nasal steroids years ago >> doesn't seem like she was using this correctly.  She never smoked.  She denies hx of asthma.  She occasional brings up sputum, but denies wheezing.  She has hx of a fib and is followed by Dr. Rayann Heman >> she had cardiac CT in December 2016 and this  showed a lung nodule.  She has f/u CT chest schedule for March 2017.  Physical Exam  General - No distress ENT - No sinus tenderness, no oral exudate, no LAN Cardiac - s1s2 regular, no murmur Chest - No wheeze/rales/dullness Back - No focal tenderness Abd - Soft, non-tender Ext - No edema Neuro - Normal strength Skin - No rashes Psych - normal mood, and behavior   Assessment/Plan  Obstructive sleep apnea. Plan: - will get copy of her CPAP download and call her with results  Vasomotor rhinitis with chronic cough. Plan: - will have her try OTC nasacort  Chronic cough with incidental finding of lung nodule on cardiac CT chest from December 2016. Plan: - will get chest xray today and call her with results - she has f/u CT chest in March 2017 >> will schedule her for follow up with me to review CT chest results    Patient Instructions  Use nasacort two sprays each nostril nightly for at least 3 weeks  Will get chest xray today  Will get copy of CPAP download  Follow up in 6 to 8 weeks     Chesley Mires, MD [place] Pager:  [phone]

## 2015-05-27 ENCOUNTER — Telehealth: Payer: Self-pay | Admitting: Pulmonary Disease

## 2015-05-27 NOTE — Telephone Encounter (Signed)
Dg Chest 2 View  05/26/2015  CLINICAL DATA:  Chronic cough for 2 months.  Atrial fibrillation. EXAM: CHEST  2 VIEW COMPARISON:  None. FINDINGS: The heart size and mediastinal contours are within normal limits. Both lungs are clear. No evidence of pneumothorax or pleural effusion. Thoracic spine degenerative changes noted. IMPRESSION: No active cardiopulmonary disease. Electronically Signed   By: Earle Gell M.D.   On: 05/26/2015 19:11    Will have my nurse inform pt that CXR did not she any lung nodules.  She still needs to have f/u CT chest in March since the chest xray might not be sensitive enough to detect nodule that was seen on Cardiac CT last year.

## 2015-05-28 NOTE — Telephone Encounter (Signed)
LM x 1 

## 2015-05-31 NOTE — Telephone Encounter (Signed)
Pt returning call for results and can be reached @ [phone].Robin [name]

## 2015-05-31 NOTE — Telephone Encounter (Signed)
I spoke with patient about results and she verbalized understanding and had no questions 

## 2015-06-08 ENCOUNTER — Telehealth: Payer: Self-pay | Admitting: Pulmonary Disease

## 2015-06-08 NOTE — Telephone Encounter (Signed)
Auto CPAP 04/27/15 to 05/26/15 >> used on 28 of 30 nights with average 7 hrs and 33 min.  Average AHI is 0.3 with median CPAP 9 cm H2O and 95 th percentile CPAP 11 cm H20.   Will have my nurse inform pt that CPAP report shows excellent control of sleep apnea.  No change to current set up needed.

## 2015-06-09 NOTE — Telephone Encounter (Signed)
LM for pt x 1  

## 2015-06-10 ENCOUNTER — Ambulatory Visit (HOSPITAL_COMMUNITY): Payer: BLUE CROSS/BLUE SHIELD

## 2015-06-11 NOTE — Telephone Encounter (Signed)
Spoke with pt. She is aware of her CPAP download results. Nothing further was needed at this time.

## 2015-06-17 ENCOUNTER — Ambulatory Visit (HOSPITAL_COMMUNITY)
Admission: RE | Admit: 2015-06-17 | Discharge: 2015-06-17 | Disposition: A | Discharge status: Home / Self Care | Payer: BLUE CROSS/BLUE SHIELD | Source: Ambulatory Visit | Attending: Nurse Practitioner | Admitting: Nurse Practitioner

## 2015-06-17 DIAGNOSIS — K449 Diaphragmatic hernia without obstruction or gangrene: Secondary | ICD-10-CM | POA: Diagnosis not present

## 2015-06-17 DIAGNOSIS — K760 Fatty (change of) liver, not elsewhere classified: Secondary | ICD-10-CM | POA: Insufficient documentation

## 2015-06-17 DIAGNOSIS — R911 Solitary pulmonary nodule: Secondary | ICD-10-CM | POA: Diagnosis present

## 2015-06-17 DIAGNOSIS — I4819 Other persistent atrial fibrillation: Secondary | ICD-10-CM

## 2015-06-18 ENCOUNTER — Ambulatory Visit: Payer: BLUE CROSS/BLUE SHIELD | Admitting: Internal Medicine

## 2015-06-28 ENCOUNTER — Ambulatory Visit (INDEPENDENT_AMBULATORY_CARE_PROVIDER_SITE_OTHER): Payer: BLUE CROSS/BLUE SHIELD | Admitting: Internal Medicine

## 2015-06-28 ENCOUNTER — Encounter: Payer: Self-pay | Admitting: Internal Medicine

## 2015-06-28 VITALS — BP 116/74 | HR 84 | Ht 62.0 in | Wt 190.4 lb

## 2015-06-28 DIAGNOSIS — I480 Paroxysmal atrial fibrillation: Secondary | ICD-10-CM | POA: Diagnosis not present

## 2015-06-28 DIAGNOSIS — G4733 Obstructive sleep apnea (adult) (pediatric): Secondary | ICD-10-CM

## 2015-06-28 NOTE — Patient Instructions (Signed)
Medication Instructions:  Your physician has recommended you make the following change in your medication:  1) Stop Flecainide 2) Stop Xarelto   Labwork: None ordered   Testing/Procedures: None ordered   Follow-Up: Your physician recommends that you schedule a follow-up appointment in: 3 months with Dr Rayann Heman   Any Other Special Instructions Will Be Listed Below (If Applicable).     If you need a refill on your cardiac medications before your next appointment, please call your pharmacy.

## 2015-06-28 NOTE — Progress Notes (Signed)
PCP:  Tawanna Solo, MD Primary Cardiologist: Ellyn Hack  The patient presents today for routine electrophysiology followup.  Since her afib ablation, the patient has done very well.  Denies procedure related complications and pleased with results.  Today, she denies symptoms of palpitations, chest pain, shortness of breath, orthopnea, PND, lower extremity edema, dizziness, presyncope, syncope, or neurologic sequela.  The patient feels that she is tolerating medications without difficulties and is otherwise without complaint today.   Past Medical History  Diagnosis Date  . Hyperlipemia   . Menopause   . Obesity (BMI 30.0-34.9)   . GERD (gastroesophageal reflux disease)   . PAF (paroxysmal atrial fibrillation) ([facility]) 01/2013    With RVR --> Myoview 02/19/2013: No ischemia/Infarct, EF 74%;; Echo 02/19/2013 : Normal EF XX123456, G1 diastolic dysfxn.  . Atrial flutter ([facility]) 01/2013  . Sinusitis   . Obesity   . OSA on CPAP   . Arthritis     "gentle; in my hands" (03/18/2015)   Past Surgical History  Procedure Laterality Date  . Carpal tunnel release Bilateral 2009  . Tonsillectomy  1960  . Transthoracic echocardiogram  November 2014    EF 60-65%. Normal LV size, thickness and function. Gr1 DD. No valvular lesions.   . Nm myoview ltd  November 2014    LOW RISK study, EF 74%. Mild attenuation artifact.  . Cataract extraction w/ intraocular lens  implant, bilateral Bilateral 11/2014 - 12/2014  . Atrial fibrillation ablation  03/18/2015  . Electrophysiologic study N/A 03/18/2015    Procedure: Atrial Fibrillation Ablation;  Surgeon: Thompson Grayer, MD;  Location: [facility];  Service: Cardiovascular;  Laterality: N/A;    Current Outpatient Prescriptions  Medication Sig Dispense Refill  . calcium citrate-vitamin D 500-400 MG-UNIT chewable tablet Chew 1 tablet by mouth daily.    . cholecalciferol (VITAMIN D) 1000 UNITS tablet Take 1,000 Units by mouth daily.    Marland Kitchen diltiazem (CARTIA XT)  120 MG 24 hr capsule Take 1 capsule (120 mg total) by mouth daily. 90 capsule 3  . fish oil-omega-3 fatty acids 1000 MG capsule Take 1 g by mouth daily.    . Multiple Vitamin (MULTIVITAMIN WITH MINERALS) TABS tablet Take 1 tablet by mouth daily.     No current facility-administered medications for this visit.    Allergies  Allergen Reactions  . Penicillins Hives    Social History   Social History  . Marital Status: Single    Spouse Name: N/A  . Number of Children: N/A  . Years of Education: N/A   Occupational History  . accountant    Social History Main Topics  . Smoking status: Never Smoker   . Smokeless tobacco: Never Used  . Alcohol Use: No  . Drug Use: No  . Sexual Activity: No   Other Topics Concern  . Not on file   Social History Narrative   She lives alone in [place]. 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     She never smoked and, and does not drink. She tries to exercise 10-15 minutes couple days a week but is limited due to lack of time.    Family History  Problem Relation Age of Onset  . Atrial fibrillation Mother   . Atrial fibrillation Father   . Stroke Maternal Grandmother   . Cancer - Lung Maternal Grandfather   . Stroke Paternal Grandmother   . Heart attack Paternal Grandfather   . Heart attack Brother  ROS-  All systems are reviewed and are negative except as outlined in the HPI above  Physical Exam: Filed Vitals:   06/28/15 1052  BP: 116/74  Pulse: 84  Height: 5\' 2"  (1.575 m)  Weight: 190 lb 6.4 oz (86.365 kg)    GEN- The patient is well appearing, alert and oriented x 3 today.   Head- normocephalic, atraumatic Eyes-  Sclera clear, conjunctiva pink Ears- hearing intact Oropharynx- clear Neck- supple,   Lungs- Clear to ausculation bilaterally, normal work of breathing Heart- Regular rate and rhythm, no murmurs, rubs or gallops, PMI not laterally displaced GI- soft, NT, ND, +  BS Extremities- no clubbing, cyanosis, or edema MS- no significant deformity or atrophy Skin- no rash or lesion Psych- euthymic mood, full affect Neuro- strength and sensation are intact  EKG - sinus rhythm rate 84 bpm,  normal intervals  Assessment and Plan:  1. Afib/ atrial flutter Maintaining sinus rhythm post ablation Stop flecainide chads2vasc is 1.  Will therefore stop xarelto at this time. Consider stopping diltiazem if no further afib in 2 months off flecanide   2. OSA Reports compliance with CPAP  3. pulm nodule Follow-up CT 06/15/15 is reassuring Results discussed with the patient  Return to see me in 3 months  Thompson Grayer MD, [facility] 06/28/2015 11:16 AM

## 2015-07-14 ENCOUNTER — Other Ambulatory Visit: Payer: Self-pay | Admitting: Cardiology

## 2015-07-14 NOTE — Telephone Encounter (Signed)
Rx refill sent to pharmacy. 

## 2015-09-13 ENCOUNTER — Encounter: Payer: Self-pay | Admitting: Pulmonary Disease

## 2015-09-13 ENCOUNTER — Ambulatory Visit (INDEPENDENT_AMBULATORY_CARE_PROVIDER_SITE_OTHER): Payer: BLUE CROSS/BLUE SHIELD | Admitting: Pulmonary Disease

## 2015-09-13 VITALS — BP 102/72 | HR 87 | Ht 62.5 in | Wt 188.0 lb

## 2015-09-13 DIAGNOSIS — G4733 Obstructive sleep apnea (adult) (pediatric): Secondary | ICD-10-CM

## 2015-09-13 NOTE — Patient Instructions (Signed)
It is nice to meet you today. Please consider adding a non-sedating anti histamine for your nasal stuffiness You can try nasal saline also for your nasal congestion. Consider using you Breath Right strips to help with your nasal stuffiness. Continue on CPAP at bedtime. You appear to be benefiting from the treatment Goal is to wear for at least 4-6 hours each night for maximal clinical benefit. Continue to work on weight loss, as the link between excess weight  and sleep apnea is well established.  Do not drive if sleepy. Follow up with Dr. Halford Chessman In 12 months  or before as needed.  Please contact office for sooner follow up if symptoms do not improve or worsen or seek emergency care

## 2015-09-13 NOTE — Assessment & Plan Note (Addendum)
  OSA with CPAP use Compliance is 71% Nasal Stuffiness causes her to remove her CPAP through the night at times. AHI=0.4 with CPAP use Plan: Please consider adding a non-sedating anti histamine for your nasal stuffiness You can try nasal saline also for your nasal congestion. Consider using you Breath Right strips to help with your nasal stuffiness. Continue on CPAP at bedtime. You appear to be benefiting from the treatment Goal is to wear for at least 4-6 hours each night for maximal clinical benefit. Continue to work on weight loss, as the link between excess weight  and sleep apnea is well established.  Do not drive if sleepy. Follow up with Dr. Halford Chessman In 12 months  or before as needed.  Please contact office for sooner follow up if symptoms do not improve or worsen or seek emergency care

## 2015-09-13 NOTE — Progress Notes (Signed)
History of Present Illness Robin [name] is a 65 y.o. female with OSA  On  CPAP  Followed by Dr. Halford Chessman.   09/13/2015 Follow Up Appointment for CPAP use. Pt. Presents to the office today for follow up. She is wearing her CPAP about 71% of the time. She states nasal stuffiness often causes her to remove the mask through the night. She does not want to use a nasal steroid, but will give consideration to using a non-sedating anti histamine after checking with Dr. Rayann Heman her cardiologist. She states that on the nights she cannot wear her CPAP she is sleepy during the day, but the nights she wears it she has no sleepiness..She has had a recent cardiac ablation by Dr. Rayann Heman. Down Load:  AHI= 0.4 Compliance =71% AutoSet 5-20 cm H2O Nasal Pillows   Past medical hx Past Medical History  Diagnosis Date  . Hyperlipemia   . Menopause   . Obesity (BMI 30.0-34.9)   . GERD (gastroesophageal reflux disease)   . PAF (paroxysmal atrial fibrillation) ([place]) 01/2013    With RVR --> Myoview 02/19/2013: No ischemia/Infarct, EF 74%;; Echo 02/19/2013 : Normal EF XX123456, G1 diastolic dysfxn.  . Atrial flutter ([place]) 01/2013  . Sinusitis   . Obesity   . OSA on CPAP   . Arthritis     "gentle; in my hands" (03/18/2015)     Past surgical hx, Family hx, Social hx all reviewed.  Current Outpatient Prescriptions on File Prior to Visit  Medication Sig  . calcium citrate-vitamin D 500-400 MG-UNIT chewable tablet Chew 1 tablet by mouth daily.  Marland Kitchen CARTIA XT 120 MG 24 hr capsule TAKE 1 BY MOUTH DAILY  . cholecalciferol (VITAMIN D) 1000 UNITS tablet Take 1,000 Units by mouth daily.  . fish oil-omega-3 fatty acids 1000 MG capsule Take 1 g by mouth daily.  . Multiple Vitamin (MULTIVITAMIN WITH MINERALS) TABS tablet Take 1 tablet by mouth daily.   No current facility-administered medications on file prior to visit.     Allergies  Allergen Reactions  . Penicillins Hives    Review Of  Systems:  Constitutional:   No  weight loss, night sweats,  Fevers, chills, fatigue, or  lassitude.  HEENT:   No headaches,  Difficulty swallowing,  Tooth/dental problems, or  Sore throat,                No sneezing, itching, ear ache, + nasal congestion, +post nasal drip,   CV:  No chest pain,  Orthopnea, PND, swelling in lower extremities, anasarca, dizziness, palpitations, syncope.   GI  No heartburn, indigestion, abdominal pain, nausea, vomiting, diarrhea, change in bowel habits, loss of appetite, bloody stools.   Resp: No shortness of breath with exertion or at rest.  No excess mucus, no productive cough,  No non-productive cough,  No coughing up of blood.  No change in color of mucus.  No wheezing.  No chest wall deformity  Skin: no rash or lesions.  GU: no dysuria, change in color of urine, no urgency or frequency.  No flank pain, no hematuria   MS:  No joint pain or swelling.  No decreased range of motion.  No back pain.  Psych:  No change in mood or affect. No depression or anxiety.  No memory loss.   Vital Signs BP 102/72 mmHg  Pulse 87  Ht 5' 2.5" (1.588 m)  Wt 188 lb (85.276 kg)  BMI 33.82 kg/m2  SpO2 96%   Physical Exam:  General-  No distress,  A&Ox3, pleasant ENT: No sinus tenderness, TM clear, pale nasal mucosa, no oral exudate,no post nasal drip, no LAN Cardiac: S1, S2, regular rate and rhythm, no murmur Chest: No wheeze/ rales/ dullness; no accessory muscle use, no nasal flaring, no sternal retractions Abd.: Soft Non-tender Ext: No clubbing cyanosis, edema Neuro:  normal strength Skin: No rashes, warm and dry Psych: normal mood and behavior   Assessment/Plan  OSA (obstructive sleep apnea)  OSA with CPAP use Compliance is 71% Nasal Stuffiness causes her to remove her CPAP through the night at times. AHI=0.4 with CPAP use Plan: Please consider adding a non-sedating anti histamine for your nasal stuffiness You can try nasal saline also for your  nasal congestion. Consider using you Breath Right strips to help with your nasal stuffiness. Continue on CPAP at bedtime. You appear to be benefiting from the treatment Goal is to wear for at least 4-6 hours each night for maximal clinical benefit. Continue to work on weight loss, as the link between excess weight  and sleep apnea is well established.  Do not drive if sleepy. Follow up with Dr. Halford Chessman In 12 months  or before as needed.  Please contact office for sooner follow up if symptoms do not improve or worsen or seek emergency care       Magdalen Spatz, NP 09/13/2015  5:13 PM  She uses CPAP whenever she doesn't have trouble with her sinuses.  She tried nasacort >> got sinus infection.  She is not having much issue with cough.  CT chest from March 2017 looked better with decreased size of nodule >> likely benign lesion.  No sinus tenderness, no wheeze, HR regular.  Assessment/plan:  Upper airway cough from sinus congestion and post-nasal drip. - she is reluctant to try nasal steroids again - she can try nasal irrigation - she will check with cardiology about whether OTC anti-histamine is an option - if her symptoms persist, then could try on singulair  Obstructive sleep apnea. - dicussed how sleep apnea can impact her risk of recurrent A fib - continue auto CPAP    Patient Instructions  It is nice to meet you today. Please consider adding a non-sedating anti histamine for your nasal stuffiness You can try nasal saline also for your nasal congestion. Consider using you Breath Right strips to help with your nasal stuffiness. Continue on CPAP at bedtime. You appear to be benefiting from the treatment Goal is to wear for at least 4-6 hours each night for maximal clinical benefit. Continue to work on weight loss, as the link between excess weight  and sleep apnea is well established.  Do not drive if sleepy. Follow up with Dr. Halford Chessman In 12 months  or before as needed.   Please contact office for sooner follow up if symptoms do not improve or worsen or seek emergency care    Chesley Mires, MD [facility] Pulmonary/Critical Care 09/13/2015, 5:44 PM Pager:  [phone]

## 2015-09-24 ENCOUNTER — Other Ambulatory Visit: Payer: Self-pay

## 2015-09-27 ENCOUNTER — Encounter: Payer: Self-pay | Admitting: Internal Medicine

## 2015-09-27 ENCOUNTER — Ambulatory Visit (INDEPENDENT_AMBULATORY_CARE_PROVIDER_SITE_OTHER): Payer: BLUE CROSS/BLUE SHIELD | Admitting: Internal Medicine

## 2015-09-27 VITALS — BP 104/78 | HR 75 | Ht 63.0 in | Wt 187.4 lb

## 2015-09-27 DIAGNOSIS — G4733 Obstructive sleep apnea (adult) (pediatric): Secondary | ICD-10-CM

## 2015-09-27 DIAGNOSIS — I480 Paroxysmal atrial fibrillation: Secondary | ICD-10-CM | POA: Diagnosis not present

## 2015-09-27 NOTE — Progress Notes (Signed)
PCP:  Tawanna Solo, MD Primary Cardiologist: Ellyn Hack  The patient presents today for routine electrophysiology followup.  Since her last visit, the patient has done very well.  She has rare palpitations which she describes as a "skipped beat".  Seems to be doing well.  Today, she denies symptoms of palpitations, chest pain, shortness of breath, orthopnea, PND, lower extremity edema, dizziness, presyncope, syncope, or neurologic sequela.  The patient feels that she is tolerating medications without difficulties and is otherwise without complaint today.   Past Medical History  Diagnosis Date  . Hyperlipemia   . Menopause   . Obesity (BMI 30.0-34.9)   . GERD (gastroesophageal reflux disease)   . PAF (paroxysmal atrial fibrillation) ([place]) 01/2013    With RVR --> Myoview 02/19/2013: No ischemia/Infarct, EF 74%;; Echo 02/19/2013 : Normal EF XX123456, G1 diastolic dysfxn.  . Atrial flutter ([place]) 01/2013  . Sinusitis   . Obesity   . OSA on CPAP   . Arthritis     "gentle; in my hands" (03/18/2015)   Past Surgical History  Procedure Laterality Date  . Carpal tunnel release Bilateral 2009  . Tonsillectomy  1960  . Transthoracic echocardiogram  November 2014    EF 60-65%. Normal LV size, thickness and function. Gr1 DD. No valvular lesions.   . Nm myoview ltd  November 2014    LOW RISK study, EF 74%. Mild attenuation artifact.  . Cataract extraction w/ intraocular lens  implant, bilateral Bilateral 11/2014 - 12/2014  . Atrial fibrillation ablation  03/18/2015  . Electrophysiologic study N/A 03/18/2015    Procedure: Atrial Fibrillation Ablation;  Surgeon: Thompson Grayer, MD;  Location: [place] [facility];  Service: Cardiovascular;  Laterality: N/A;    Current Outpatient Prescriptions  Medication Sig Dispense Refill  . calcium citrate-vitamin D 500-400 MG-UNIT chewable tablet Chew 1 tablet by mouth daily.    . cholecalciferol (VITAMIN D) 1000 UNITS tablet Take 1,000 Units by mouth daily.    Marland Kitchen  diltiazem (CARTIA XT) 120 MG 24 hr capsule Take 120 mg by mouth every other day.     . Ferrous Sulfate (IRON) 325 (65 Fe) MG TABS Take 1 tablet by mouth every Monday, Wednesday, and Friday.    . fish oil-omega-3 fatty acids 1000 MG capsule Take 1 g by mouth daily.    . Multiple Vitamin (MULTIVITAMIN WITH MINERALS) TABS tablet Take 1 tablet by mouth daily.     No current facility-administered medications for this visit.    Allergies  Allergen Reactions  . Penicillins Hives    Social History   Social History  . Marital Status: Single    Spouse Name: N/A  . Number of Children: N/A  . Years of Education: N/A   Occupational History  . accountant    Social History Main Topics  . Smoking status: Never Smoker   . Smokeless tobacco: Never Used  . Alcohol Use: No  . Drug Use: No  . Sexual Activity: No   Other Topics Concern  . Not on file   Social History Narrative   She lives alone in [place]. 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     She never smoked and, and does not drink. She tries to exercise 10-15 minutes couple days a week but is limited due to lack of time.    Family History  Problem Relation Age of Onset  . Atrial fibrillation Mother   . Atrial fibrillation Father   .  Stroke Maternal Grandmother   . Cancer - Lung Maternal Grandfather   . Stroke Paternal Grandmother   . Heart attack Paternal Grandfather   . Heart attack Brother     ROS-  All systems are reviewed and are negative except as outlined in the HPI above  Physical Exam: Filed Vitals:   09/27/15 0953  BP: 104/78  Pulse: 75  Height: 5\' 3"  (1.6 m)  Weight: 187 lb 6.4 oz (85.004 kg)    GEN- The patient is well appearing, alert and oriented x 3 today.   Head- normocephalic, atraumatic Eyes-  Sclera clear, conjunctiva pink Ears- hearing intact Oropharynx- clear Neck- supple,   Lungs- Clear to ausculation bilaterally, normal work of  breathing Heart- Regular rate and rhythm, no murmurs, rubs or gallops, PMI not laterally displaced GI- soft, NT, ND, + BS Extremities- no clubbing, cyanosis, or edema MS- no significant deformity or atrophy Skin- no rash or lesion Psych- euthymic mood, full affect Neuro- strength and sensation are intact  EKG - sinus rhythm rate 75 bpm,  normal intervals  Assessment and Plan:  1. Afib/ atrial flutter Maintaining sinus rhythm post ablation off AADs Pleased with results Stop cardizem Can take flecainide and diltiazem prn  chads2vasc is 1.  Not on anticoagualtion at this time.  2. OSA Reports compliance with CPAP  Return to see me in 3 months  Thompson Grayer MD, [facility] 09/27/2015 10:35 AM

## 2015-09-27 NOTE — Patient Instructions (Signed)
Medication Instructions:  Your physician has recommended you make the following change in your medication:  1) Stop Cartia   Labwork: None ordered   Testing/Procedures: None ordered   Follow-Up: Your physician recommends that you schedule a follow-up appointment in: 3 months with Dr Rayann Heman   Any Other Special Instructions Will Be Listed Below (If Applicable).     If you need a refill on your cardiac medications before your next appointment, please call your pharmacy.

## 2015-12-29 ENCOUNTER — Ambulatory Visit (INDEPENDENT_AMBULATORY_CARE_PROVIDER_SITE_OTHER): Payer: BLUE CROSS/BLUE SHIELD | Admitting: Internal Medicine

## 2015-12-29 ENCOUNTER — Other Ambulatory Visit: Payer: Self-pay

## 2015-12-29 ENCOUNTER — Encounter: Payer: Self-pay | Admitting: Internal Medicine

## 2015-12-29 ENCOUNTER — Encounter (INDEPENDENT_AMBULATORY_CARE_PROVIDER_SITE_OTHER): Payer: Self-pay

## 2015-12-29 VITALS — BP 100/70 | HR 89 | Ht 62.5 in | Wt 184.0 lb

## 2015-12-29 DIAGNOSIS — I480 Paroxysmal atrial fibrillation: Secondary | ICD-10-CM | POA: Diagnosis not present

## 2015-12-29 DIAGNOSIS — G4733 Obstructive sleep apnea (adult) (pediatric): Secondary | ICD-10-CM | POA: Diagnosis not present

## 2015-12-29 DIAGNOSIS — K219 Gastro-esophageal reflux disease without esophagitis: Secondary | ICD-10-CM | POA: Diagnosis not present

## 2015-12-29 NOTE — Patient Instructions (Signed)
Medication Instructions:  Your physician recommends that you continue on your current medications as directed. Please refer to the Current Medication list given to you today.   Labwork: None ordered   Testing/Procedures: None ordered   Follow-Up: Your physician wants you to follow-up in: 3 months with Roderic Palau, NP and 6 months with Dr. Rayann Heman.  You will receive a reminder letter in the mail two months in advance. If you don't receive a letter, please call our office to schedule the follow-up appointment.   Any Other Special Instructions Will Be Listed Below (If Applicable).     If you need a refill on your cardiac medications before your next appointment, please call your pharmacy.

## 2015-12-29 NOTE — Progress Notes (Signed)
PCP:  Tawanna Solo, MD Primary Cardiologist: Ellyn Hack  The patient presents today for routine electrophysiology followup.  Since her last visit, the patient has done very well. Denies afib.  Off of prilosec, her chronic reflux has returned.  She states that she has previously been told that she had a hiatal hernia but has not seen GI.  Today, she denies symptoms of palpitations, chest pain, shortness of breath, orthopnea, PND, lower extremity edema, dizziness, presyncope, syncope, or neurologic sequela.  The patient feels that she is tolerating medications without difficulties and is otherwise without complaint today.   Past Medical History:  Diagnosis Date  . Arthritis    "gentle; in my hands" (03/18/2015)  . Atrial flutter ([place]) 01/2013  . GERD (gastroesophageal reflux disease)   . Hyperlipemia   . Menopause   . Obesity   . Obesity (BMI 30.0-34.9)   . OSA on CPAP   . PAF (paroxysmal atrial fibrillation) ([place]) 01/2013   With RVR --> Myoview 02/19/2013: No ischemia/Infarct, EF 74%;; Echo 02/19/2013 : Normal EF XX123456, G1 diastolic dysfxn.  . Sinusitis    Past Surgical History:  Procedure Laterality Date  . ATRIAL FIBRILLATION ABLATION  03/18/2015  . CARPAL TUNNEL RELEASE Bilateral 2009  . CATARACT EXTRACTION W/ INTRAOCULAR LENS  IMPLANT, BILATERAL Bilateral 11/2014 - 12/2014  . ELECTROPHYSIOLOGIC STUDY N/A 03/18/2015   Procedure: Atrial Fibrillation Ablation;  Surgeon: Thompson Grayer, MD;  Location: [place] [facility];  Service: Cardiovascular;  Laterality: N/A;  . NM MYOVIEW LTD  November 2014   LOW RISK study, EF 74%. Mild attenuation artifact.  . TONSILLECTOMY  1960  . TRANSTHORACIC ECHOCARDIOGRAM  November 2014   EF 60-65%. Normal LV size, thickness and function. Gr1 DD. No valvular lesions.     Current Outpatient Prescriptions  Medication Sig Dispense Refill  . Calcium Citrate-Vitamin D 500-500 MG-UNIT PACK Take 1 tablet by mouth daily.    . cholecalciferol (VITAMIN D) 1000  UNITS tablet Take 1,000 Units by mouth daily.    . Ferrous Sulfate (IRON) 325 (65 Fe) MG TABS Take 1 tablet by mouth every Monday, Wednesday, and Friday.    . fish oil-omega-3 fatty acids 1000 MG capsule Take 1 g by mouth daily.    . Multiple Vitamin (MULTIVITAMIN WITH MINERALS) TABS tablet Take 1 tablet by mouth daily.     No current facility-administered medications for this visit.     Allergies  Allergen Reactions  . Penicillins Hives    Social History   Social History  . Marital status: Single    Spouse name: N/A  . Number of children: N/A  . Years of education: N/A   Occupational History  . accountant    Social History Main Topics  . Smoking status: Never Smoker  . Smokeless tobacco: Never Used  . Alcohol use No  . Drug use: No  . Sexual activity: No   Other Topics Concern  . Not on file   Social History Narrative   She lives alone in [place]. 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     She never smoked and, and does not drink. She tries to exercise 10-15 minutes couple days a week but is limited due to lack of time.    Family History  Problem Relation Age of Onset  . Atrial fibrillation Mother   . Atrial fibrillation Father   . Stroke Maternal Grandmother   . Cancer - Lung Maternal Grandfather   .  Stroke Paternal Grandmother   . Heart attack Paternal Grandfather   . Heart attack Brother     ROS-  All systems are reviewed and are negative except as outlined in the HPI above  Physical Exam: Vitals:   12/29/15 0932  BP: 100/70  Pulse: 89  Weight: 184 lb (83.5 kg)  Height: 5' 2.5" (1.588 m)    GEN- The patient is well appearing, alert and oriented x 3 today.   Head- normocephalic, atraumatic Eyes-  Sclera clear, conjunctiva pink Ears- hearing intact Oropharynx- clear Neck- supple,   Lungs- Clear to ausculation bilaterally, normal work of breathing Heart- Regular rate and rhythm, no murmurs, rubs or  gallops, PMI not laterally displaced GI- soft, NT, ND, + BS Extremities- no clubbing, cyanosis, or edema MS- no significant deformity or atrophy Skin- no rash or lesion Psych- euthymic mood, full affect Neuro- strength and sensation are intact  EKG - sinus rhythm rate 89 bpm,  normal intervals, nonspecific St/T changes  Assessment and Plan:  1. Afib/ atrial flutter Maintaining sinus rhythm post ablation off AADs Can take flecainide and diltiazem prn  chads2vasc is 1.  Not on anticoagualtion at this time.  2. OSA Reports compliance with CPAP  3. Overweight Body mass index is 33.12 kg/m. We discussed regular exercise and weight reduction today  4. GERD Given worsening of symptoms, I have encouraged her to restart prilosec and contact Dr Sabra Heck to see if referral to GI would be beneficial  Return to see Butch Penny in AF clinic in 3 months I will see in 6 months  Thompson Grayer MD, [facility] 12/29/2015 10:18 AM

## 2016-03-22 ENCOUNTER — Ambulatory Visit (HOSPITAL_COMMUNITY)
Admission: RE | Admit: 2016-03-22 | Discharge: 2016-03-22 | Disposition: A | Discharge status: Home / Self Care | Payer: BLUE CROSS/BLUE SHIELD | Source: Ambulatory Visit | Attending: Nurse Practitioner | Admitting: Nurse Practitioner

## 2016-03-22 ENCOUNTER — Encounter (HOSPITAL_COMMUNITY): Payer: Self-pay | Admitting: Nurse Practitioner

## 2016-03-22 VITALS — BP 118/80 | HR 79 | Ht 62.5 in | Wt 186.6 lb

## 2016-03-22 DIAGNOSIS — Z880 Allergy status to penicillin: Secondary | ICD-10-CM | POA: Insufficient documentation

## 2016-03-22 DIAGNOSIS — Z8679 Personal history of other diseases of the circulatory system: Secondary | ICD-10-CM

## 2016-03-22 DIAGNOSIS — G4733 Obstructive sleep apnea (adult) (pediatric): Secondary | ICD-10-CM | POA: Diagnosis not present

## 2016-03-22 DIAGNOSIS — R9431 Abnormal electrocardiogram [ECG] [EKG]: Secondary | ICD-10-CM | POA: Insufficient documentation

## 2016-03-22 DIAGNOSIS — I481 Persistent atrial fibrillation: Secondary | ICD-10-CM | POA: Insufficient documentation

## 2016-03-22 DIAGNOSIS — Z79899 Other long term (current) drug therapy: Secondary | ICD-10-CM | POA: Insufficient documentation

## 2016-03-22 DIAGNOSIS — Z9889 Other specified postprocedural states: Secondary | ICD-10-CM | POA: Diagnosis not present

## 2016-03-22 NOTE — Progress Notes (Signed)
Patient ID: Robin [name], female   DOB: 1950/09/13, 65 y.o.   MRN: [id number]     Primary Care Physician: Tawanna Solo, MD Referring Physician: Dr. Charlies Constable is a 65 y.o. female with a h/o persistent afib that is here for f/u of ablation 12/8.   She is off anticoagulants and antiarrythmic's. Using cpap. She had a small of amount of afib in first few weeks following ablation, but otherwise none    Today, she denies symptoms of palpitations, chest pain, shortness of breath, orthopnea, PND, lower extremity edema, dizziness, presyncope, syncope, or neurologic sequela. The patient is tolerating medications without difficulties and is otherwise without complaint today.   Past Medical History:  Diagnosis Date  . Arthritis    "gentle; in my hands" (03/18/2015)  . Atrial flutter ([place]) 01/2013  . GERD (gastroesophageal reflux disease)   . Hyperlipemia   . Menopause   . Obesity   . Obesity (BMI 30.0-34.9)   . OSA on CPAP   . PAF (paroxysmal atrial fibrillation) ([place]) 01/2013   With RVR --> Myoview 02/19/2013: No ischemia/Infarct, EF 74%;; Echo 02/19/2013 : Normal EF XX123456, G1 diastolic dysfxn.  . Sinusitis    Past Surgical History:  Procedure Laterality Date  . ATRIAL FIBRILLATION ABLATION  03/18/2015  . CARPAL TUNNEL RELEASE Bilateral 2009  . CATARACT EXTRACTION W/ INTRAOCULAR LENS  IMPLANT, BILATERAL Bilateral 11/2014 - 12/2014  . ELECTROPHYSIOLOGIC STUDY N/A 03/18/2015   Procedure: Atrial Fibrillation Ablation;  Surgeon: Thompson Grayer, MD;  Location: [place] [facility];  Service: Cardiovascular;  Laterality: N/A;  . NM MYOVIEW LTD  November 2014   LOW RISK study, EF 74%. Mild attenuation artifact.  . TONSILLECTOMY  1960  . TRANSTHORACIC ECHOCARDIOGRAM  November 2014   EF 60-65%. Normal LV size, thickness and function. Gr1 DD. No valvular lesions.     Current Outpatient Prescriptions  Medication Sig Dispense Refill  . calcium carbonate (CALCIUM 600) 600 MG TABS  tablet Take 600 mg by mouth 2 (two) times daily with a meal.    . cholecalciferol (VITAMIN D) 1000 UNITS tablet Take 1,000 Units by mouth daily.    Marland Kitchen co-enzyme Q-10 30 MG capsule Take 30 mg by mouth daily.    . Ferrous Sulfate (IRON) 325 (65 Fe) MG TABS Take 1 tablet by mouth every Monday, Wednesday, and Friday.    . fish oil-omega-3 fatty acids 1000 MG capsule Take 1 g by mouth daily.    Marland Kitchen FLUoxetine (PROZAC) 20 MG tablet Take 20 mg by mouth daily.    . Multiple Vitamin (MULTIVITAMIN WITH MINERALS) TABS tablet Take 1 tablet by mouth daily.     No current facility-administered medications for this encounter.     Allergies  Allergen Reactions  . Penicillins Hives    Social History   Social History  . Marital status: Single    Spouse name: N/A  . Number of children: N/A  . Years of education: N/A   Occupational History  . accountant    Social History Main Topics  . Smoking status: Never Smoker  . Smokeless tobacco: Never Used  . Alcohol use No  . Drug use: No  . Sexual activity: No   Other Topics Concern  . Not on file   Social History Narrative   She lives alone in [place]. 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     She never smoked and, and does not drink. She  tries to exercise 10-15 minutes couple days a week but is limited due to lack of time.    Family History  Problem Relation Age of Onset  . Atrial fibrillation Mother   . Atrial fibrillation Father   . Stroke Maternal Grandmother   . Cancer - Lung Maternal Grandfather   . Stroke Paternal Grandmother   . Heart attack Paternal Grandfather   . Heart attack Brother     ROS- All systems are reviewed and negative except as per the HPI above  Physical Exam: Vitals:   03/22/16 0959  BP: 118/80  Pulse: 79  Weight: 186 lb 9.6 oz (84.6 kg)  Height: 5' 2.5" (1.588 m)    GEN- The patient is well appearing, alert and oriented x 3 today.   Head- normocephalic,  atraumatic Eyes-  Sclera clear, conjunctiva pink Ears- hearing intact Oropharynx- clear Neck- supple, no JVP Lymph- no cervical lymphadenopathy Lungs- Clear to ausculation bilaterally, normal work of breathing Heart- Regular rate and rhythm, no murmurs, rubs or gallops, PMI not laterally displaced GI- soft, NT, ND, + BS Extremities- no clubbing, cyanosis, or edema MS- no significant deformity or atrophy Skin- no rash or lesion Psych- euthymic mood, full affect Neuro- strength and sensation are intact  EKG- NSR, at 79 bpm, PR int 164 ms, qrs int 90 ms, qtc 408 ms.  Epic records reviewed  Assessment and Plan: 1. Persistent afib Doing well s/p ablation 03/18/15  2. OSA Continue cpap F/u with Dr. Halford Chessman for compliance as scheduled    F/u with Dr. Rayann Heman as scheduled in March  [name] C. Renalda Locklin, [facility] [address] [place], [place] [address] [phone]

## 2016-04-20 ENCOUNTER — Other Ambulatory Visit: Payer: Self-pay | Admitting: Family Medicine

## 2016-04-20 DIAGNOSIS — R1030 Lower abdominal pain, unspecified: Secondary | ICD-10-CM

## 2016-04-27 ENCOUNTER — Other Ambulatory Visit: Payer: BLUE CROSS/BLUE SHIELD

## 2016-04-28 ENCOUNTER — Ambulatory Visit
Admission: RE | Admit: 2016-04-28 | Discharge: 2016-04-28 | Disposition: A | Discharge status: Home / Self Care | Payer: BLUE CROSS/BLUE SHIELD | Source: Ambulatory Visit | Attending: Family Medicine | Admitting: Family Medicine

## 2016-04-28 DIAGNOSIS — R1030 Lower abdominal pain, unspecified: Secondary | ICD-10-CM

## 2016-05-12 ENCOUNTER — Other Ambulatory Visit: Payer: Self-pay | Admitting: Family Medicine

## 2016-05-15 ENCOUNTER — Other Ambulatory Visit: Payer: Self-pay | Admitting: Family Medicine

## 2016-05-15 DIAGNOSIS — R1030 Lower abdominal pain, unspecified: Secondary | ICD-10-CM

## 2016-05-18 ENCOUNTER — Ambulatory Visit
Admission: RE | Admit: 2016-05-18 | Discharge: 2016-05-18 | Disposition: A | Discharge status: Home / Self Care | Payer: BLUE CROSS/BLUE SHIELD | Source: Ambulatory Visit | Attending: Family Medicine | Admitting: Family Medicine

## 2016-05-18 DIAGNOSIS — R1030 Lower abdominal pain, unspecified: Secondary | ICD-10-CM

## 2016-05-18 MED ORDER — IOPAMIDOL (ISOVUE-300) INJECTION 61%
100.0000 mL | Freq: Once | INTRAVENOUS | Status: AC | PRN
  Administered 2016-05-18: 100 mL via INTRAVENOUS

## 2016-06-30 ENCOUNTER — Encounter: Payer: Self-pay | Admitting: Internal Medicine

## 2016-06-30 ENCOUNTER — Ambulatory Visit (INDEPENDENT_AMBULATORY_CARE_PROVIDER_SITE_OTHER): Payer: BLUE CROSS/BLUE SHIELD | Admitting: Internal Medicine

## 2016-06-30 VITALS — BP 100/68 | HR 72 | Ht 63.0 in | Wt 180.0 lb

## 2016-06-30 DIAGNOSIS — G4733 Obstructive sleep apnea (adult) (pediatric): Secondary | ICD-10-CM | POA: Diagnosis not present

## 2016-06-30 DIAGNOSIS — K219 Gastro-esophageal reflux disease without esophagitis: Secondary | ICD-10-CM | POA: Diagnosis not present

## 2016-06-30 DIAGNOSIS — I480 Paroxysmal atrial fibrillation: Secondary | ICD-10-CM

## 2016-06-30 DIAGNOSIS — I483 Typical atrial flutter: Secondary | ICD-10-CM | POA: Diagnosis not present

## 2016-06-30 NOTE — Progress Notes (Signed)
PCP:  Tawanna Solo, MD Primary Cardiologist: Ellyn Hack  The patient presents today for routine electrophysiology followup.  Since her last visit, the patient has done very well.  No afib.   Today, she denies symptoms of palpitations, chest pain, shortness of breath, orthopnea, PND, lower extremity edema, dizziness, presyncope, syncope, or neurologic sequela.  The patient feels that she is tolerating medications without difficulties and is otherwise without complaint today.   Past Medical History:  Diagnosis Date  . Arthritis    "gentle; in my hands" (03/18/2015)  . Atrial flutter ([place]) 01/2013  . GERD (gastroesophageal reflux disease)   . Hyperlipemia   . Menopause   . Obesity   . Obesity (BMI 30.0-34.9)   . OSA on CPAP   . PAF (paroxysmal atrial fibrillation) ([place]) 01/2013   With RVR --> Myoview 02/19/2013: No ischemia/Infarct, EF 74%;; Echo 02/19/2013 : Normal EF ~47-82%, G1 diastolic dysfxn.  . Sinusitis    Past Surgical History:  Procedure Laterality Date  . ATRIAL FIBRILLATION ABLATION  03/18/2015  . CARPAL TUNNEL RELEASE Bilateral 2009  . CATARACT EXTRACTION W/ INTRAOCULAR LENS  IMPLANT, BILATERAL Bilateral 11/2014 - 12/2014  . ELECTROPHYSIOLOGIC STUDY N/A 03/18/2015   Procedure: Atrial Fibrillation Ablation;  Surgeon: Thompson Grayer, MD;  Location: [facility];  Service: Cardiovascular;  Laterality: N/A;  . NM MYOVIEW LTD  November 2014   LOW RISK study, EF 74%. Mild attenuation artifact.  . TONSILLECTOMY  1960  . TRANSTHORACIC ECHOCARDIOGRAM  November 2014   EF 60-65%. Normal LV size, thickness and function. Gr1 DD. No valvular lesions.     Current Outpatient Prescriptions  Medication Sig Dispense Refill  . calcium carbonate (CALCIUM 600) 600 MG TABS tablet Take 600 mg by mouth 2 (two) times daily with a meal.    . cholecalciferol (VITAMIN D) 1000 UNITS tablet Take 1,000 Units by mouth daily.    Marland Kitchen co-enzyme Q-10 30 MG capsule Take 30 mg by mouth daily.    .  Ferrous Sulfate (IRON) 325 (65 Fe) MG TABS Take 1 tablet by mouth every Monday, Wednesday, and Friday.    . fish oil-omega-3 fatty acids 1000 MG capsule Take 1 g by mouth daily.    Marland Kitchen FLUoxetine (PROZAC) 20 MG tablet Take 20 mg by mouth daily.    . Multiple Vitamin (MULTIVITAMIN WITH MINERALS) TABS tablet Take 1 tablet by mouth daily.     No current facility-administered medications for this visit.     Allergies  Allergen Reactions  . Penicillin G Other (See Comments)    Welps  . Penicillins Hives    Social History   Social History  . Marital status: Single    Spouse name: N/A  . Number of children: N/A  . Years of education: N/A   Occupational History  . accountant    Social History Main Topics  . Smoking status: Never Smoker  . Smokeless tobacco: Never Used  . Alcohol use No  . Drug use: No  . Sexual activity: No   Other Topics Concern  . Not on file   Social History Narrative   She lives alone in [place]. 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     She never smoked and, and does not drink. She tries to exercise 10-15 minutes couple days a week but is limited due to lack of time.    Family History  Problem Relation Age of Onset  . Atrial fibrillation Mother   .  Atrial fibrillation Father   . Stroke Maternal Grandmother   . Cancer - Lung Maternal Grandfather   . Stroke Paternal Grandmother   . Heart attack Paternal Grandfather   . Heart attack Brother     ROS-  All systems are reviewed and are negative except as outlined in the HPI above  Physical Exam: Vitals:   06/30/16 1218  BP: 100/68  Pulse: 72  SpO2: 97%  Weight: 180 lb (81.6 kg)  Height: 5\' 3"  (1.6 m)    GEN- The patient is well appearing, alert and oriented x 3 today.   Head- normocephalic, atraumatic Eyes-  Sclera clear, conjunctiva pink Ears- hearing intact Oropharynx- clear Neck- supple,   Lungs- Clear to ausculation bilaterally, normal  work of breathing Heart- Regular rate and rhythm, no murmurs, rubs or gallops, PMI not laterally displaced GI- soft, NT, ND, + BS Extremities- no clubbing, cyanosis, or edema Psych- euthymic mood, full affect Neuro- strength and sensation are intact  EKG - sinus rhythm rate 70 bpm,  normal intervals, nonspecific St/T changes are unchanged from prior  Assessment and Plan:  1. Afib/ atrial flutter Maintaining sinus rhythm post ablation off AADs chads2vasc is 1.  Not on anticoagualtion at this time.  2. OSA Reports compliance with CPAP  3. Overweight Body mass index is 31.89 kg/m. stable  4. GERD Followed by PCP  Return to see me in 1 year unless problems arise in the interim  Thompson Grayer MD, [facility] 06/30/2016 12:42 PM

## 2016-06-30 NOTE — Patient Instructions (Signed)
Medication Instructions: - Your physician recommends that you continue on your current medications as directed. Please refer to the Current Medication list given to you today.  Labwork: - none ordered  Procedures/Testing: - none ordered  Follow-Up: - Your physician wants you to follow-up in: 1 year with Dr. Rayann Heman. You will receive a reminder letter in the mail two months in advance. If you don't receive a letter, please call our office to schedule the follow-up appointment.   Any Additional Special Instructions Will Be Listed Below (If Applicable).     If you need a refill on your cardiac medications before your next appointment, please call your pharmacy.

## 2017-05-14 ENCOUNTER — Other Ambulatory Visit: Payer: Self-pay | Admitting: Family Medicine

## 2017-05-14 ENCOUNTER — Other Ambulatory Visit (HOSPITAL_COMMUNITY)
Admission: RE | Admit: 2017-05-14 | Discharge: 2017-05-14 | Disposition: A | Discharge status: Home / Self Care | Payer: BLUE CROSS/BLUE SHIELD | Source: Ambulatory Visit | Attending: Family Medicine | Admitting: Family Medicine

## 2017-05-14 DIAGNOSIS — Z124 Encounter for screening for malignant neoplasm of cervix: Secondary | ICD-10-CM | POA: Insufficient documentation

## 2017-05-16 LAB — CYTOLOGY - PAP
DIAGNOSIS: NEGATIVE
HPV (WINDOPATH): NOT DETECTED

## 2017-08-31 IMAGING — CT CT PELVIS WO/W CM
1 of 2 series · 14 of 32 positions shown, 20 images · IV contrast (APPLIED)
Comparison: None.

CLINICAL DATA: Right groin pain for 3 months.

EXAM:
CT PELVIS WITH AND WITHOUT CONTRAST
TECHNIQUE: Multidetector CT imaging of the pelvis was performed following the
standard protocol before and following the bolus administration of
intravenous contrast.
CONTRAST:  100mL 7HQITB-Q77 IOPAMIDOL (7HQITB-Q77) INJECTION 61%

[Series 6: routine pelvis w/cm · axial · 0.91mm/px · z∈[-369,-149]mm · 14 of 52 slices shown, 20 images]
[im 4/52  soft-tissue]
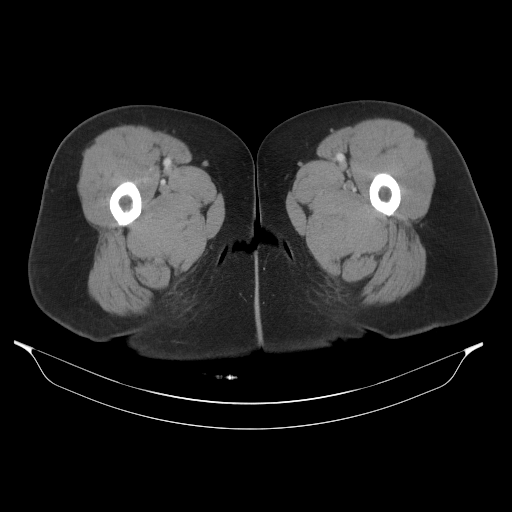
[im 4/52  bone]
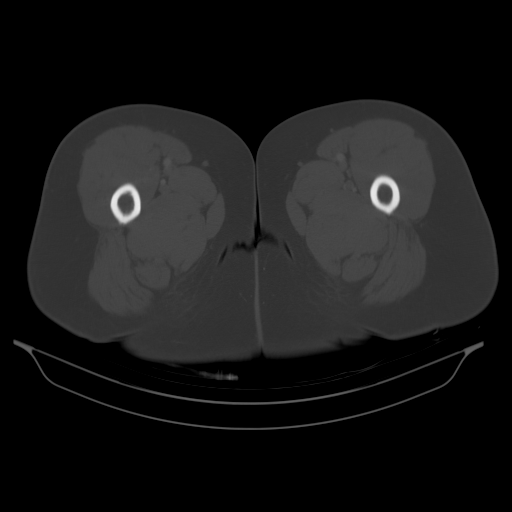
[im 7/52  soft-tissue]
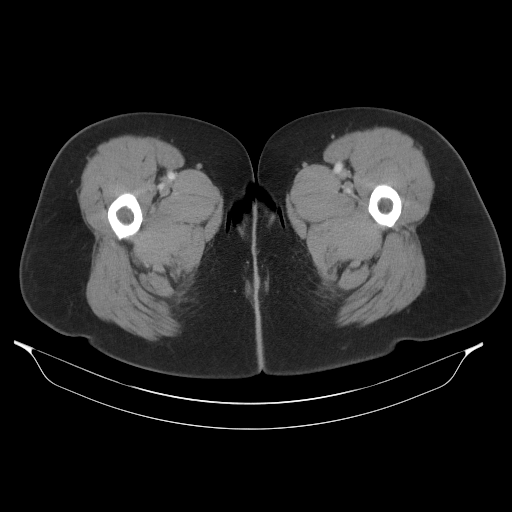
[im 10/52  soft-tissue]
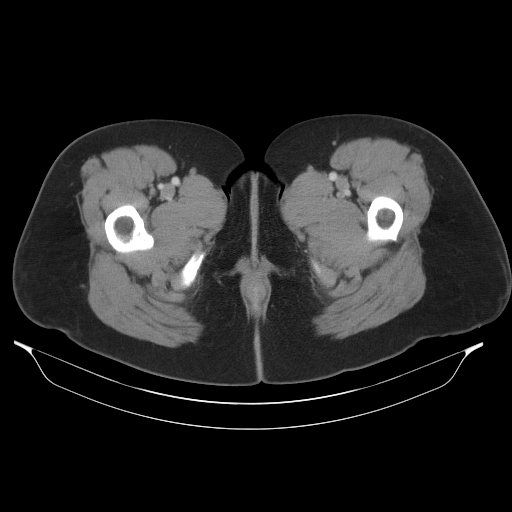
[im 13/52  soft-tissue]
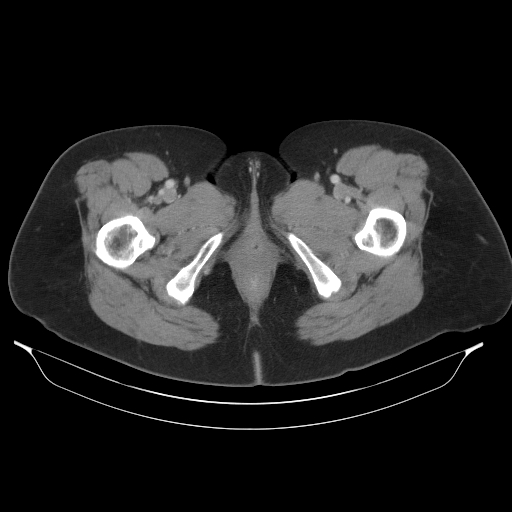
[im 16/52  soft-tissue]
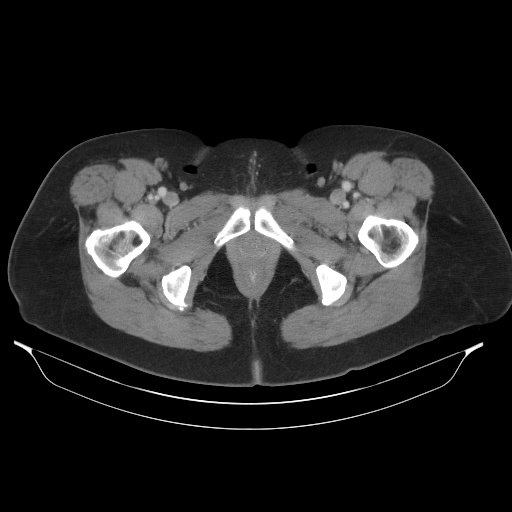
[im 20/52  soft-tissue]
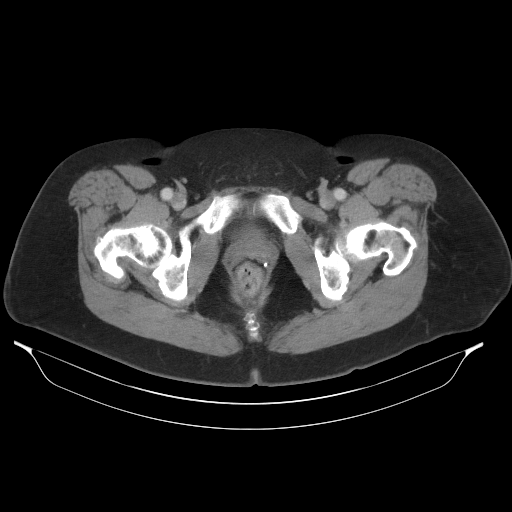
[im 23/52  soft-tissue]
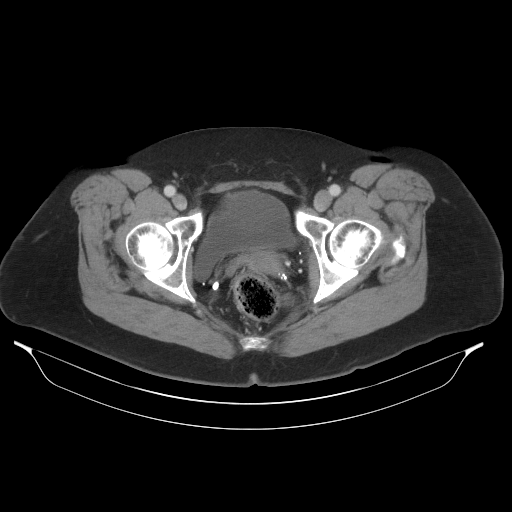
[im 29/52  soft-tissue]
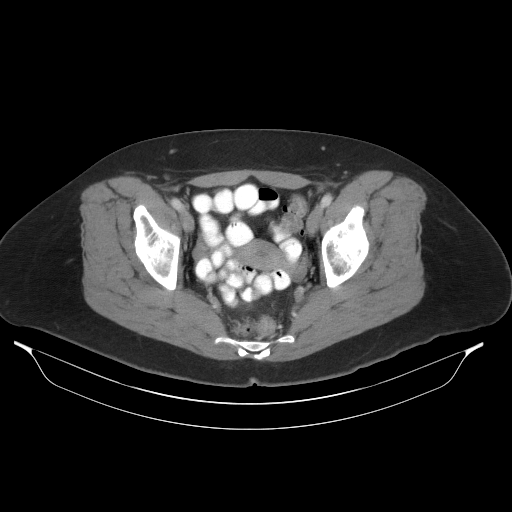
[im 32/52  soft-tissue]
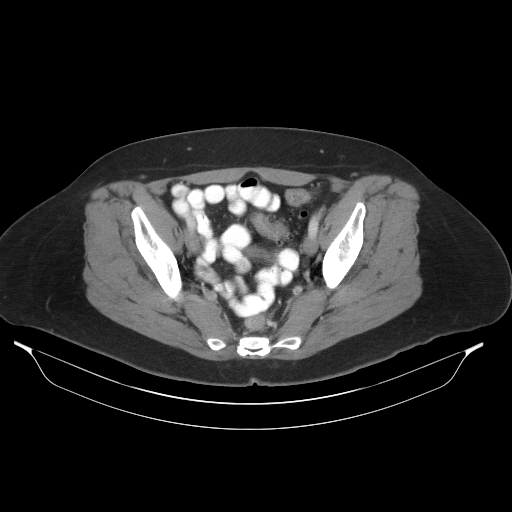
[im 32/52  bone]
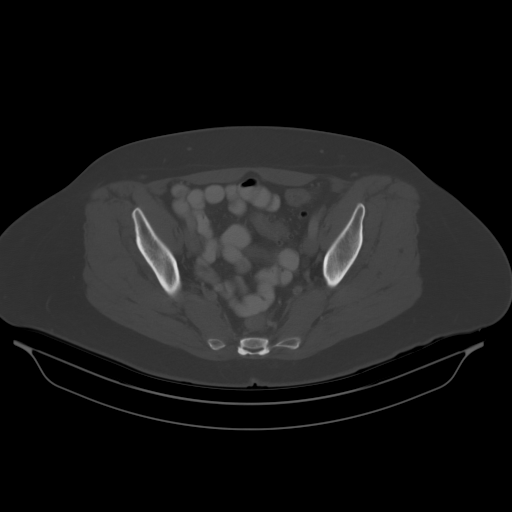
[im 36/52  soft-tissue]
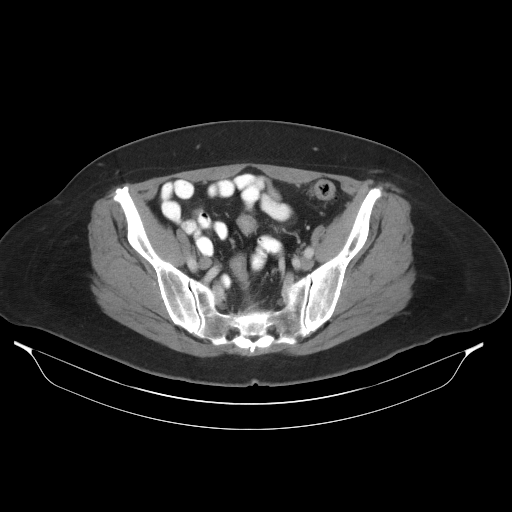
[im 39/52  soft-tissue]
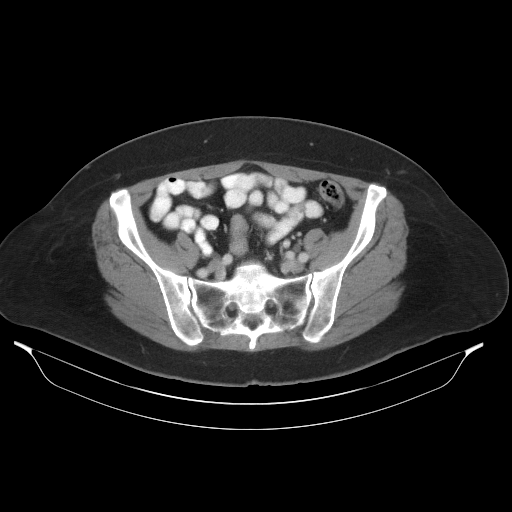
[im 39/52  lung]
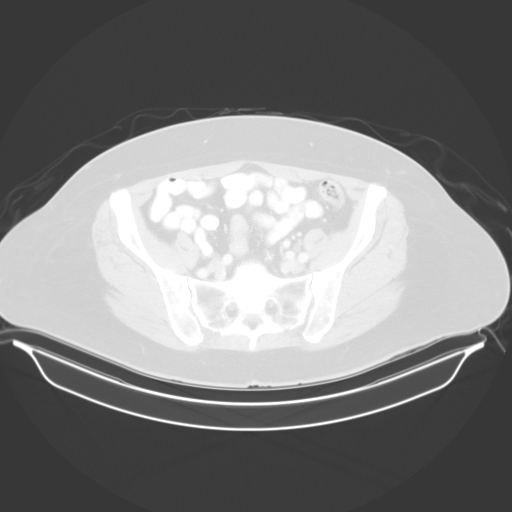
[im 42/52  soft-tissue]
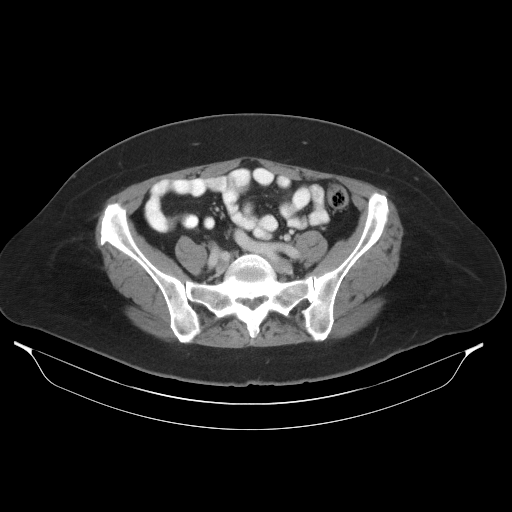
[im 42/52  lung]
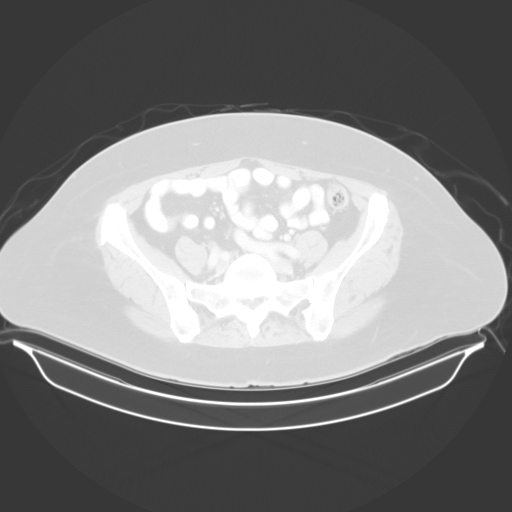
[im 45/52  soft-tissue]
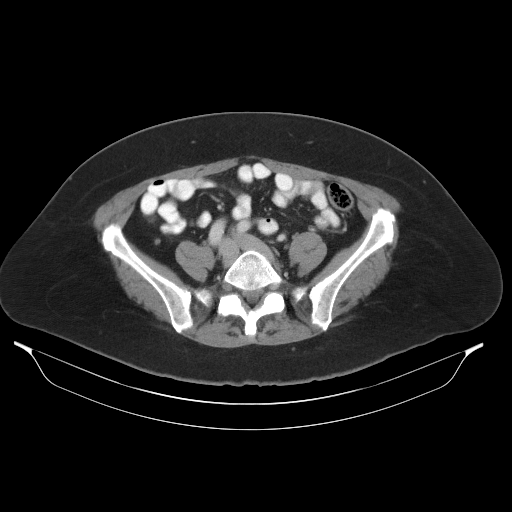
[im 45/52  lung]
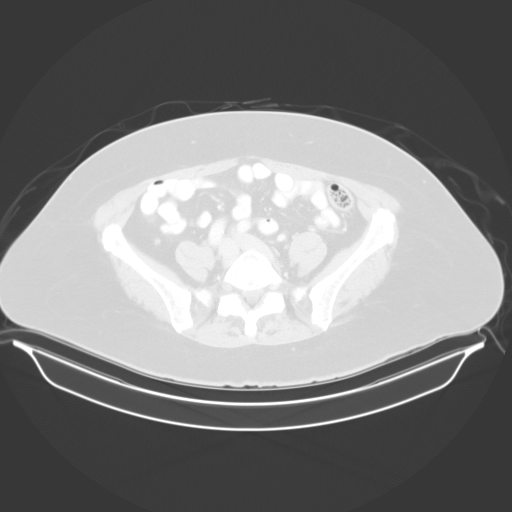
[im 48/52  soft-tissue]
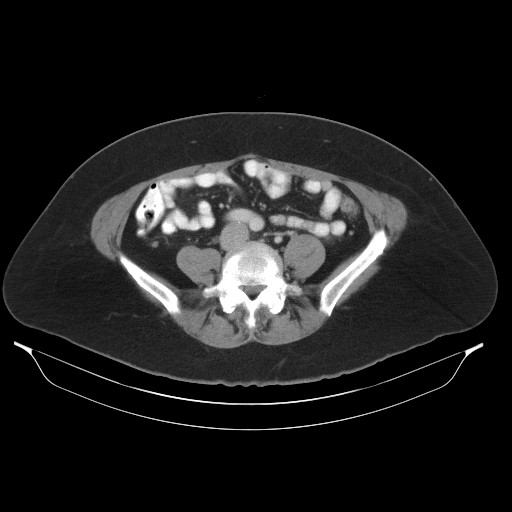
[im 48/52  lung]
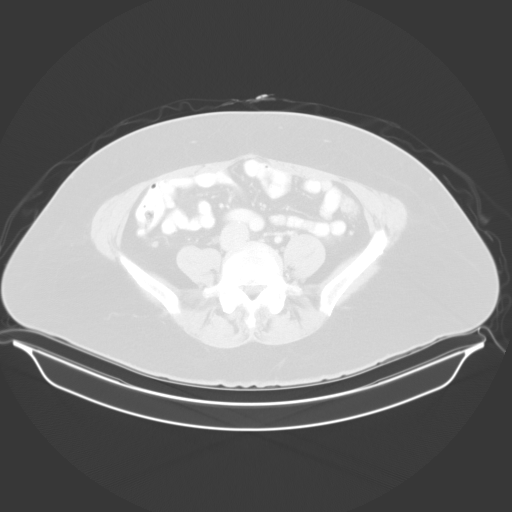

[14 of 32 positions shown; findings below may reference images not displayed]

FINDINGS: Urinary Tract: Unremarkable urinary bladder.

Bowel: Mild sigmoid diverticulosis noted, without evidence of
diverticulitis. Normal appendix visualized.

Vascular/Lymphatic: No pathologically enlarged lymph nodes or other
significant abnormality.

Reproductive:  No mass or other significant abnormality.

Other: No evidence of inguinal hernia or mass.

Musculoskeletal: No significant abnormality identified.
IMPRESSION: No evidence of inguinal hernia, mass, or other acute findings.

Sigmoid colon diverticulosis. No radiographic evidence of
diverticulitis.

## 2018-01-16 DIAGNOSIS — E6609 Other obesity due to excess calories: Secondary | ICD-10-CM | POA: Diagnosis not present

## 2018-01-16 DIAGNOSIS — Z23 Encounter for immunization: Secondary | ICD-10-CM | POA: Diagnosis not present

## 2018-01-16 DIAGNOSIS — F325 Major depressive disorder, single episode, in full remission: Secondary | ICD-10-CM | POA: Diagnosis not present

## 2018-01-16 DIAGNOSIS — G4730 Sleep apnea, unspecified: Secondary | ICD-10-CM | POA: Diagnosis not present

## 2018-01-16 DIAGNOSIS — K219 Gastro-esophageal reflux disease without esophagitis: Secondary | ICD-10-CM | POA: Diagnosis not present

## 2018-01-16 DIAGNOSIS — Z6834 Body mass index (BMI) 34.0-34.9, adult: Secondary | ICD-10-CM | POA: Diagnosis not present

## 2018-02-13 ENCOUNTER — Telehealth: Payer: Self-pay | Admitting: Internal Medicine

## 2018-02-13 NOTE — Telephone Encounter (Signed)
Overdue to see me (last visit 3/18).  She is previously seen and managed for OSA by Dr Halford Chessman.  Could she contact his office for follow-up as she is already his patient?

## 2018-02-13 NOTE — Telephone Encounter (Signed)
  Patient would like a referral for sleep apnea

## 2018-02-14 NOTE — Telephone Encounter (Signed)
Called and spoke with patient regarding VS message below. Scheduled appt with TP on 02/18/18 for 9am Nothing further needed at this time.

## 2018-02-14 NOTE — Telephone Encounter (Signed)
[name], please call to schedule ROV with me or NP for f/u of sleep apnea.

## 2018-02-18 ENCOUNTER — Ambulatory Visit (INDEPENDENT_AMBULATORY_CARE_PROVIDER_SITE_OTHER): Payer: Medicare Other | Admitting: Adult Health

## 2018-02-18 ENCOUNTER — Encounter: Payer: Self-pay | Admitting: Adult Health

## 2018-02-18 DIAGNOSIS — J310 Chronic rhinitis: Secondary | ICD-10-CM | POA: Diagnosis not present

## 2018-02-18 DIAGNOSIS — G4733 Obstructive sleep apnea (adult) (pediatric): Secondary | ICD-10-CM | POA: Diagnosis not present

## 2018-02-18 NOTE — Progress Notes (Signed)
@Patient  ID: Robin [name], female    DOB: [birth year], 67 y.o.   MRN: [id number]  Chief Complaint  Patient presents with  . Follow-up    OSA    Referring provider: Kathyrn Lass, MD  HPI: 67 year old female never smoker followed for obstructive sleep apnea, Chronic cough , Lung nodule  Medical history significant for A. fib  TEST/EVENTS :  PSG 1//1615 >> AHI 32, SpO2 low 71% Echo 03/11/15 >> EF 60 to 97%, grade 1 diastolic dysfx, mild MR Cardiac CT 03/15/15 >>  1.5 cm GGO Rt lower lung CT chest 06/2015 > right middle lobe subpleural density less conspicuous and most consistent with postinflammatory scarring   02/18/2018 Follow up : OSA  Presents for a follow-up for sleep apnea.  Patient says overall she is doing well on CPAP.  Patient says she did not have insurance until recently.  Was last seen in 2017.  Patient says she is using her CPAP every night.  She says she cannot sleep without it.  She says she needs all new supplies.  Does have some trouble falling asleep at times.  But says this is chronic. CPAP download was requested  Has chronic allergies with nasal stuffiness and drainage.  Denies any cough. Previously had trouble with a cough.  Says that this is resolved totally.         Allergies  Allergen Reactions  . Penicillin G Other (See Comments)    Welps  . Penicillins Hives    Immunization History  Administered Date(s) Administered  . Influenza Split 01/08/2018  . Influenza,inj,Quad PF,6+ Mos 01/08/2013  . Influenza-Unspecified 02/08/2014  . Pneumococcal Polysaccharide-23 01/16/2018  . Zoster 04/10/2013  . Zoster Recombinat (Shingrix) 12/21/2017    Past Medical History:  Diagnosis Date  . Arthritis    "gentle; in my hands" (03/18/2015)  . Atrial flutter ([place]) 01/2013  . GERD (gastroesophageal reflux disease)   . Hyperlipemia   . Menopause   . Obesity   . Obesity (BMI 30.0-34.9)   . OSA on CPAP   . PAF (paroxysmal atrial fibrillation) ([place])  01/2013   With RVR --> Myoview 02/19/2013: No ischemia/Infarct, EF 74%;; Echo 02/19/2013 : Normal EF ~41-63%, G1 diastolic dysfxn.  . Sinusitis     Tobacco History: Social History   Tobacco Use  Smoking Status Never Smoker  Smokeless Tobacco Never Used   Counseling given: Not Answered   Outpatient Medications Prior to Visit  Medication Sig Dispense Refill  . calcium carbonate (CALCIUM 600) 600 MG TABS tablet Take 600 mg by mouth daily with breakfast.     . cholecalciferol (VITAMIN D) 1000 UNITS tablet Take 1,000 Units by mouth daily.    Marland Kitchen co-enzyme Q-10 30 MG capsule Take 30 mg by mouth daily.    . Ferrous Sulfate (IRON) 325 (65 Fe) MG TABS Take 1 tablet by mouth every Monday, Wednesday, and Friday.    . fish oil-omega-3 fatty acids 1000 MG capsule Take 1 g by mouth daily.    Marland Kitchen FLUoxetine (PROZAC) 20 MG tablet Take 20 mg by mouth every other day.     . Multiple Vitamin (MULTIVITAMIN WITH MINERALS) TABS tablet Take 1 tablet by mouth daily.    Marland Kitchen omeprazole (PRILOSEC) 20 MG capsule Take 20 mg by mouth every other day.     No facility-administered medications prior to visit.      Review of Systems  Constitutional:   No  weight loss, night sweats,  Fevers, chills, fatigue, or  lassitude.  HEENT:   No headaches,  Difficulty swallowing,  Tooth/dental problems, or  Sore throat,                No sneezing, itching, ear ache,  +nasal congestion, post nasal drip,   CV:  No chest pain,  Orthopnea, PND, swelling in lower extremities, anasarca, dizziness, palpitations, syncope.   GI  No heartburn, indigestion, abdominal pain, nausea, vomiting, diarrhea, change in bowel habits, loss of appetite, bloody stools.   Resp: No shortness of breath with exertion or at rest.  No excess mucus, no productive cough,  No non-productive cough,  No coughing up of blood.  No change in color of mucus.  No wheezing.  No chest wall deformity  Skin: no rash or lesions.  GU: no dysuria, change in color of  urine, no urgency or frequency.  No flank pain, no hematuria   MS:  No joint pain or swelling.  No decreased range of motion.  No back pain.    Physical Exam  BP 124/80 (BP Location: Left Arm, Cuff Size: Normal)   Pulse 87   Ht 5\' 2"  (1.575 m)   Wt 188 lb 3.2 oz (85.4 kg)   SpO2 94%   BMI 34.42 kg/m   GEN: A/Ox3; pleasant , NAD, obese    HEENT:  [place]/AT,  EACs-clear, TMs-wnl, NOSE-clear, THROAT-clear, no lesions, no postnasal drip or exudate noted. Class 2-3 MP airway   NECK:  Supple w/ fair ROM; no JVD; normal carotid impulses w/o bruits; no thyromegaly or nodules palpated; no lymphadenopathy.    RESP  Clear  P & A; w/o, wheezes/ rales/ or rhonchi. no accessory muscle use, no dullness to percussion  CARD:  RRR, no m/r/g, no peripheral edema, pulses intact, no cyanosis or clubbing.  GI:   Soft & nt; nml bowel sounds; no organomegaly or masses detected.   Musco: Warm bil, no deformities or joint swelling noted.   Neuro: alert, no focal deficits noted.    Skin: Warm, no lesions or rashes    Lab Results:   BNP No results found for: BNP  ProBNP No results found for: PROBNP  Imaging: No results found.    No flowsheet data found.  No results found for: NITRICOXIDE      Assessment & Plan:   OSA (obstructive sleep apnea) Compensated on CPAP.  CPAP download was requested  Plan  Patient Instructions  Saline nasal spray As needed   Saline nasal gel, AYR , As needed At bedtime   Nasacort nasal 2 puffs daily  As needed   Zyrtec 10mg  At bedtime  As needed  Drainage .  Continue on CPAP at bedtime Order for CPAP supplies. Work on healthy weight Follow-up in 1 year and as needed with Dr. Halford Chessman            Chronic rhinitis Plan  Patient Instructions  Saline nasal spray As needed   Saline nasal gel, AYR , As needed At bedtime   Nasacort nasal 2 puffs daily  As needed   Zyrtec 10mg  At bedtime  As needed  Drainage .  Continue on CPAP at bedtime Order  for CPAP supplies. Work on healthy weight Follow-up in 1 year and as needed with Dr. Holland Falling, NP 02/18/2018

## 2018-02-18 NOTE — Progress Notes (Signed)
Reviewed and agree with assessment/plan.   Allan Minotti, MD [facility] 04/05/2016, 12:24 PM Pager:  [phone]  

## 2018-02-18 NOTE — Assessment & Plan Note (Signed)
Compensated on CPAP.  CPAP download was requested  Plan  Patient Instructions  Saline nasal spray As needed   Saline nasal gel, AYR , As needed At bedtime   Nasacort nasal 2 puffs daily  As needed   Zyrtec 10mg  At bedtime  As needed  Drainage .  Continue on CPAP at bedtime Order for CPAP supplies. Work on healthy weight Follow-up in 1 year and as needed with Dr. Halford Chessman

## 2018-02-18 NOTE — Patient Instructions (Addendum)
Saline nasal spray As needed   Saline nasal gel, AYR , As needed At bedtime   Nasacort nasal 2 puffs daily  As needed   Zyrtec 10mg  At bedtime  As needed  Drainage .  Continue on CPAP at bedtime Order for CPAP supplies. Work on healthy weight Follow-up in 1 year and as needed with Dr. Halford Chessman

## 2018-02-18 NOTE — Addendum Note (Signed)
Addended by: Parke Poisson E on: 02/18/2018 10:15 AM   Modules accepted: Orders

## 2018-02-18 NOTE — Assessment & Plan Note (Signed)
Plan  Patient Instructions  Saline nasal spray As needed   Saline nasal gel, AYR , As needed At bedtime   Nasacort nasal 2 puffs daily  As needed   Zyrtec 10mg  At bedtime  As needed  Drainage .  Continue on CPAP at bedtime Order for CPAP supplies. Work on healthy weight Follow-up in 1 year and as needed with Dr. Halford Chessman

## 2018-04-22 DIAGNOSIS — Z1231 Encounter for screening mammogram for malignant neoplasm of breast: Secondary | ICD-10-CM | POA: Diagnosis not present

## 2018-11-01 DIAGNOSIS — E782 Mixed hyperlipidemia: Secondary | ICD-10-CM | POA: Diagnosis not present

## 2018-11-01 DIAGNOSIS — H6123 Impacted cerumen, bilateral: Secondary | ICD-10-CM | POA: Diagnosis not present

## 2018-11-01 DIAGNOSIS — K219 Gastro-esophageal reflux disease without esophagitis: Secondary | ICD-10-CM | POA: Diagnosis not present

## 2018-11-01 DIAGNOSIS — E6609 Other obesity due to excess calories: Secondary | ICD-10-CM | POA: Diagnosis not present

## 2018-11-01 DIAGNOSIS — R002 Palpitations: Secondary | ICD-10-CM | POA: Diagnosis not present

## 2018-11-01 DIAGNOSIS — G4730 Sleep apnea, unspecified: Secondary | ICD-10-CM | POA: Diagnosis not present

## 2018-11-01 DIAGNOSIS — Z0000 Encounter for general adult medical examination without abnormal findings: Secondary | ICD-10-CM | POA: Diagnosis not present

## 2018-11-01 DIAGNOSIS — M8580 Other specified disorders of bone density and structure, unspecified site: Secondary | ICD-10-CM | POA: Diagnosis not present

## 2018-11-01 DIAGNOSIS — F325 Major depressive disorder, single episode, in full remission: Secondary | ICD-10-CM | POA: Diagnosis not present

## 2019-01-03 DIAGNOSIS — Z23 Encounter for immunization: Secondary | ICD-10-CM | POA: Diagnosis not present

## 2019-02-05 DIAGNOSIS — Z23 Encounter for immunization: Secondary | ICD-10-CM | POA: Diagnosis not present

## 2019-02-19 ENCOUNTER — Other Ambulatory Visit: Payer: Self-pay

## 2019-02-19 ENCOUNTER — Encounter: Payer: Self-pay | Admitting: Adult Health

## 2019-02-19 ENCOUNTER — Ambulatory Visit (INDEPENDENT_AMBULATORY_CARE_PROVIDER_SITE_OTHER): Payer: Medicare Other | Admitting: Adult Health

## 2019-02-19 DIAGNOSIS — G4733 Obstructive sleep apnea (adult) (pediatric): Secondary | ICD-10-CM

## 2019-02-19 DIAGNOSIS — J310 Chronic rhinitis: Secondary | ICD-10-CM | POA: Diagnosis not present

## 2019-02-19 NOTE — Addendum Note (Signed)
Addended by: Eileen Stanford on: 02/19/2019 09:43 AM   Modules accepted: Orders

## 2019-02-19 NOTE — Assessment & Plan Note (Signed)
Controlled on current regimen no changes 

## 2019-02-19 NOTE — Progress Notes (Signed)
@Patient  ID: Robin [name], female    DOB: 1950/10/18, 68 y.o.   MRN: [id number]  Chief Complaint  Patient presents with  . Follow-up    OSA     Referring provider: Kathyrn Lass, MD  HPI: 68 year old female never smoker followed for obstructive sleep apnea, chronic cough/allergic rhinitis Pulmonary nodule felt to be consistent with postinflammatory scarring  TEST/EVENTS :  PSG 1//1615 >> AHI 32, SpO2 low 71% Echo 03/11/15 >> EF 60 to 123456, grade 1 diastolic dysfx, mild MR Cardiac CT 03/15/15 >>1.5 cm GGO Rt lower lung CT chest 06/2015 > right middle lobe subpleural density less conspicuous and most consistent with postinflammatory scarring  02/19/2019 Follow up : OSA , [place]  Patient presents for a 1 year follow-up for sleep apnea.  She says she is doing very well on CPAP.  She wears it every single night.  Never misses any.  Says she tries to get in around 6 hours each night.  Says occasionally she has nasal stuffiness and congestion and feels that her CPAP pressure can be high when she has this. CPAP download shows excellent compliance with 100% usage.  Daily average usage at around 6 hours.  Patient is on CPAP auto 5 to 20 cm H2O.  AHI 0.2.  Minimum leaks.  Patient has chronic rhinitis.  Takes Nasacort and Zyrtec as needed.  Says she does not need it often and feels that she has been under somewhat good control.  Says she is trying to be active.  Walks does yard work.  She lives independently.  Allergies  Allergen Reactions  . Penicillin G Other (See Comments)    Welps  . Penicillins Hives    Immunization History  Administered Date(s) Administered  . Influenza Split 01/08/2018  . Influenza,inj,Quad PF,6+ Mos 01/08/2013  . Influenza-Unspecified 02/08/2014, 01/08/2019  . Pneumococcal Polysaccharide-23 01/16/2018  . Zoster 04/10/2013  . Zoster Recombinat (Shingrix) 12/21/2017    Past Medical History:  Diagnosis Date  . Arthritis    "gentle; in my hands" (03/18/2015)   . Atrial flutter ([place]) 01/2013  . GERD (gastroesophageal reflux disease)   . Hyperlipemia   . Menopause   . Obesity   . Obesity (BMI 30.0-34.9)   . OSA on CPAP   . PAF (paroxysmal atrial fibrillation) ([place]) 01/2013   With RVR --> Myoview 02/19/2013: No ischemia/Infarct, EF 74%;; Echo 02/19/2013 : Normal EF [id number], G1 diastolic dysfxn.  . Sinusitis     Tobacco History: Social History   Tobacco Use  Smoking Status Never Smoker  Smokeless Tobacco Never Used   Counseling given: Not Answered   Outpatient Medications Prior to Visit  Medication Sig Dispense Refill  . calcium carbonate (CALCIUM 600) 600 MG TABS tablet Take 600 mg by mouth daily with breakfast.     . cholecalciferol (VITAMIN D) 1000 UNITS tablet Take 1,000 Units by mouth daily.    . Ferrous Sulfate (IRON) 325 (65 Fe) MG TABS Take 1 tablet by mouth every Monday, Wednesday, and Friday.    . fish oil-omega-3 fatty acids 1000 MG capsule Take 1 g by mouth daily.    Marland Kitchen FLUoxetine (PROZAC) 20 MG tablet Take 20 mg by mouth every other day.     . Multiple Vitamin (MULTIVITAMIN WITH MINERALS) TABS tablet Take 1 tablet by mouth daily.    Marland Kitchen omeprazole (PRILOSEC) 20 MG capsule Take 20 mg by mouth every other day.    Marland Kitchen co-enzyme Q-10 30 MG capsule Take 30 mg by mouth daily.  No facility-administered medications prior to visit.      Review of Systems:   Constitutional:   No  weight loss, night sweats,  Fevers, chills, fatigue, or  lassitude.  HEENT:   No headaches,  Difficulty swallowing,  Tooth/dental problems, or  Sore throat,                No sneezing, itching, ear ache,  +nasal congestion, post nasal drip,   CV:  No chest pain,  Orthopnea, PND, swelling in lower extremities, anasarca, dizziness, palpitations, syncope.   GI  No heartburn, indigestion, abdominal pain, nausea, vomiting, diarrhea, change in bowel habits, loss of appetite, bloody stools.   Resp: No shortness of breath with exertion or at rest.  No excess  mucus, no productive cough,  No non-productive cough,  No coughing up of blood.  No change in color of mucus.  No wheezing.  No chest wall deformity  Skin: no rash or lesions.  GU: no dysuria, change in color of urine, no urgency or frequency.  No flank pain, no hematuria   MS:  No joint pain or swelling.  No decreased range of motion.  No back pain.    Physical Exam  BP 112/68 (BP Location: Left Arm, Cuff Size: Normal)   Pulse 98   Temp (!) 97 F (36.1 C) (Temporal)   Ht 5' 1.97" (1.574 m)   Wt 184 lb (83.5 kg)   SpO2 95%   BMI 33.69 kg/m   GEN: A/Ox3; pleasant , NAD, obese per BMI   HEENT:  [place]/AT,   NOSE-clear, THROAT-clear, no lesions, no postnasal drip or exudate noted.   NECK:  Supple w/ fair ROM; no JVD; normal carotid impulses w/o bruits; no thyromegaly or nodules palpated; no lymphadenopathy.    RESP  Clear  P & A; w/o, wheezes/ rales/ or rhonchi. no accessory muscle use, no dullness to percussion  CARD:  RRR, no m/r/g, tr peripheral edema, pulses intact, no cyanosis or clubbing.  GI:   Soft & nt; nml bowel sounds; no organomegaly or masses detected.   Musco: Warm bil, no deformities or joint swelling noted.   Neuro: alert, no focal deficits noted.    Skin: Warm, no lesions or rashes    Lab Results:  CBC  BNP No results found for: BNP  ProBNP No results found for: PROBNP  Imaging: No results found.    No flowsheet data found.  No results found for: NITRICOXIDE      Assessment & Plan:   OSA (obstructive sleep apnea) Severe obstructive sleep apnea with excellent compliance and control on nocturnal CPAP. We will adjust CPAP pressure to 5 to 15 cm H2O for comfort. Continue to use saline nasal spray and saline nasal gel as needed  Plan  Patient Instructions  Saline nasal spray As needed   Saline nasal gel, AYR , As needed At bedtime   Nasacort nasal 2 puffs daily  As needed   Zyrtec 10mg  At bedtime  As needed  Drainage .  Continue on  CPAP at bedtime Keep up good work .  Order for CPAP supplies. Work on healthy weight Do not drive if sleepy . + Follow-up in 1 year and as needed with Dr. Halford Chessman            Chronic rhinitis Controlled on current regimen no changes     Rexene Edison, NP 02/19/2019

## 2019-02-19 NOTE — Progress Notes (Signed)
Reviewed and agree with assessment/plan.   Tykera Skates, MD [facility] 04/05/2016, 12:24 PM Pager:  [phone]  

## 2019-02-19 NOTE — Patient Instructions (Signed)
Saline nasal spray As needed   Saline nasal gel, AYR , As needed At bedtime   Nasacort nasal 2 puffs daily  As needed   Zyrtec 10mg  At bedtime  As needed  Drainage .  Continue on CPAP at bedtime Keep up good work .  Order for CPAP supplies. Work on healthy weight Do not drive if sleepy . + Follow-up in 1 year and as needed with Dr. Halford Chessman

## 2019-02-19 NOTE — Assessment & Plan Note (Signed)
Severe obstructive sleep apnea with excellent compliance and control on nocturnal CPAP. We will adjust CPAP pressure to 5 to 15 cm H2O for comfort. Continue to use saline nasal spray and saline nasal gel as needed  Plan  Patient Instructions  Saline nasal spray As needed   Saline nasal gel, AYR , As needed At bedtime   Nasacort nasal 2 puffs daily  As needed   Zyrtec 10mg  At bedtime  As needed  Drainage .  Continue on CPAP at bedtime Keep up good work .  Order for CPAP supplies. Work on healthy weight Do not drive if sleepy . + Follow-up in 1 year and as needed with Dr. Halford Chessman

## 2019-04-28 DIAGNOSIS — Z1231 Encounter for screening mammogram for malignant neoplasm of breast: Secondary | ICD-10-CM | POA: Diagnosis not present

## 2019-05-06 DIAGNOSIS — R921 Mammographic calcification found on diagnostic imaging of breast: Secondary | ICD-10-CM | POA: Diagnosis not present

## 2019-05-14 ENCOUNTER — Other Ambulatory Visit: Payer: Self-pay | Admitting: Radiology

## 2019-05-14 DIAGNOSIS — R921 Mammographic calcification found on diagnostic imaging of breast: Secondary | ICD-10-CM | POA: Diagnosis not present

## 2019-05-14 DIAGNOSIS — D242 Benign neoplasm of left breast: Secondary | ICD-10-CM | POA: Diagnosis not present

## 2019-06-08 ENCOUNTER — Ambulatory Visit: Payer: Medicare Other | Attending: Internal Medicine

## 2019-06-08 DIAGNOSIS — Z23 Encounter for immunization: Secondary | ICD-10-CM | POA: Insufficient documentation

## 2019-06-08 NOTE — Progress Notes (Signed)
   Covid-19 Vaccination Clinic  Name:  [facility]    MRN: [id number] DOB: [birth year]  06/08/2019  Ms. Bryner was observed post Covid-19 immunization for 15 minutes without incidence. She was provided with Vaccine Information Sheet and instruction to access the V-Safe system.   Ms. Portilla was instructed to call 911 with any severe reactions post vaccine: Marland Kitchen Difficulty breathing  . Swelling of your face and throat  . A fast heartbeat  . A bad rash all over your body  . Dizziness and weakness    Immunizations Administered    Name Date Dose VIS Date Route   Pfizer COVID-19 Vaccine 06/08/2019  3:03 PM 0.3 mL 03/21/2019 Intramuscular   Manufacturer: [place]   Lot: [id number]   [place]: [id number]

## 2019-07-09 ENCOUNTER — Ambulatory Visit: Payer: Medicare Other | Attending: Internal Medicine

## 2019-07-09 DIAGNOSIS — Z23 Encounter for immunization: Secondary | ICD-10-CM

## 2019-07-09 NOTE — Progress Notes (Signed)
   [facility] Vaccination [facility]  Name:  Robin [name]    MRN: [id number] DOB: [birth year]  07/09/2019  [name] was observed post Covid-19 immunization for 15 minutes without incident. She was provided with Vaccine Information Sheet and instruction to access the V-Safe system.   [name] was instructed to call 911 with any severe reactions post vaccine: Marland Kitchen Difficulty breathing  . Swelling of face and throat  . A fast heartbeat  . A bad rash all over body  . Dizziness and weakness   Immunizations Administered    Name Date Dose VIS Date Route   Pfizer COVID-19 Vaccine 07/09/2019  4:43 PM 0.3 mL 03/21/2019 Intramuscular   Manufacturer: [place]   Lot: [id number]   [place]: [id number]

## 2019-11-05 DIAGNOSIS — Z0000 Encounter for general adult medical examination without abnormal findings: Secondary | ICD-10-CM | POA: Diagnosis not present

## 2019-11-05 DIAGNOSIS — M8580 Other specified disorders of bone density and structure, unspecified site: Secondary | ICD-10-CM | POA: Diagnosis not present

## 2019-11-05 DIAGNOSIS — Z9889 Other specified postprocedural states: Secondary | ICD-10-CM | POA: Diagnosis not present

## 2019-11-05 DIAGNOSIS — G4730 Sleep apnea, unspecified: Secondary | ICD-10-CM | POA: Diagnosis not present

## 2019-11-05 DIAGNOSIS — M5431 Sciatica, right side: Secondary | ICD-10-CM | POA: Diagnosis not present

## 2019-11-05 DIAGNOSIS — E6609 Other obesity due to excess calories: Secondary | ICD-10-CM | POA: Diagnosis not present

## 2019-11-05 DIAGNOSIS — E782 Mixed hyperlipidemia: Secondary | ICD-10-CM | POA: Diagnosis not present

## 2019-11-05 DIAGNOSIS — K219 Gastro-esophageal reflux disease without esophagitis: Secondary | ICD-10-CM | POA: Diagnosis not present

## 2019-11-05 DIAGNOSIS — F325 Major depressive disorder, single episode, in full remission: Secondary | ICD-10-CM | POA: Diagnosis not present

## 2019-12-02 DIAGNOSIS — M5431 Sciatica, right side: Secondary | ICD-10-CM | POA: Diagnosis not present

## 2019-12-11 DIAGNOSIS — M5431 Sciatica, right side: Secondary | ICD-10-CM | POA: Diagnosis not present

## 2019-12-13 DIAGNOSIS — Z20828 Contact with and (suspected) exposure to other viral communicable diseases: Secondary | ICD-10-CM | POA: Diagnosis not present

## 2019-12-17 DIAGNOSIS — Z20828 Contact with and (suspected) exposure to other viral communicable diseases: Secondary | ICD-10-CM | POA: Diagnosis not present

## 2019-12-19 DIAGNOSIS — M5431 Sciatica, right side: Secondary | ICD-10-CM | POA: Diagnosis not present

## 2019-12-22 DIAGNOSIS — M5431 Sciatica, right side: Secondary | ICD-10-CM | POA: Diagnosis not present

## 2019-12-25 DIAGNOSIS — M5431 Sciatica, right side: Secondary | ICD-10-CM | POA: Diagnosis not present

## 2019-12-30 DIAGNOSIS — M5431 Sciatica, right side: Secondary | ICD-10-CM | POA: Diagnosis not present

## 2020-01-01 DIAGNOSIS — M5431 Sciatica, right side: Secondary | ICD-10-CM | POA: Diagnosis not present

## 2020-01-05 DIAGNOSIS — M5431 Sciatica, right side: Secondary | ICD-10-CM | POA: Diagnosis not present

## 2020-01-08 DIAGNOSIS — M5431 Sciatica, right side: Secondary | ICD-10-CM | POA: Diagnosis not present

## 2020-01-12 DIAGNOSIS — M545 Low back pain, unspecified: Secondary | ICD-10-CM | POA: Diagnosis not present

## 2020-01-12 DIAGNOSIS — M5431 Sciatica, right side: Secondary | ICD-10-CM | POA: Diagnosis not present

## 2020-01-12 DIAGNOSIS — M5440 Lumbago with sciatica, unspecified side: Secondary | ICD-10-CM | POA: Diagnosis not present

## 2020-01-15 DIAGNOSIS — M545 Low back pain, unspecified: Secondary | ICD-10-CM | POA: Diagnosis not present

## 2020-01-15 DIAGNOSIS — M5440 Lumbago with sciatica, unspecified side: Secondary | ICD-10-CM | POA: Diagnosis not present

## 2020-01-15 DIAGNOSIS — M5431 Sciatica, right side: Secondary | ICD-10-CM | POA: Diagnosis not present

## 2020-01-19 DIAGNOSIS — M5431 Sciatica, right side: Secondary | ICD-10-CM | POA: Diagnosis not present

## 2020-01-19 DIAGNOSIS — M5440 Lumbago with sciatica, unspecified side: Secondary | ICD-10-CM | POA: Diagnosis not present

## 2020-01-19 DIAGNOSIS — M545 Low back pain, unspecified: Secondary | ICD-10-CM | POA: Diagnosis not present

## 2020-01-22 DIAGNOSIS — M5440 Lumbago with sciatica, unspecified side: Secondary | ICD-10-CM | POA: Diagnosis not present

## 2020-01-22 DIAGNOSIS — M545 Low back pain, unspecified: Secondary | ICD-10-CM | POA: Diagnosis not present

## 2020-01-22 DIAGNOSIS — M5431 Sciatica, right side: Secondary | ICD-10-CM | POA: Diagnosis not present

## 2020-01-26 DIAGNOSIS — M5440 Lumbago with sciatica, unspecified side: Secondary | ICD-10-CM | POA: Diagnosis not present

## 2020-01-26 DIAGNOSIS — M545 Low back pain, unspecified: Secondary | ICD-10-CM | POA: Diagnosis not present

## 2020-01-26 DIAGNOSIS — M5431 Sciatica, right side: Secondary | ICD-10-CM | POA: Diagnosis not present

## 2020-02-11 DIAGNOSIS — Z23 Encounter for immunization: Secondary | ICD-10-CM | POA: Diagnosis not present

## 2020-03-02 DIAGNOSIS — Z23 Encounter for immunization: Secondary | ICD-10-CM | POA: Diagnosis not present

## 2020-09-23 DIAGNOSIS — Z23 Encounter for immunization: Secondary | ICD-10-CM | POA: Diagnosis not present

## 2020-11-09 DIAGNOSIS — Z0000 Encounter for general adult medical examination without abnormal findings: Secondary | ICD-10-CM | POA: Diagnosis not present

## 2020-11-09 DIAGNOSIS — M5136 Other intervertebral disc degeneration, lumbar region: Secondary | ICD-10-CM | POA: Diagnosis not present

## 2020-11-09 DIAGNOSIS — Z1389 Encounter for screening for other disorder: Secondary | ICD-10-CM | POA: Diagnosis not present

## 2020-11-09 DIAGNOSIS — K219 Gastro-esophageal reflux disease without esophagitis: Secondary | ICD-10-CM | POA: Diagnosis not present

## 2020-11-09 DIAGNOSIS — E782 Mixed hyperlipidemia: Secondary | ICD-10-CM | POA: Diagnosis not present

## 2020-11-09 DIAGNOSIS — F325 Major depressive disorder, single episode, in full remission: Secondary | ICD-10-CM | POA: Diagnosis not present

## 2020-11-09 DIAGNOSIS — G4730 Sleep apnea, unspecified: Secondary | ICD-10-CM | POA: Diagnosis not present

## 2020-11-25 DIAGNOSIS — G4733 Obstructive sleep apnea (adult) (pediatric): Secondary | ICD-10-CM | POA: Diagnosis not present

## 2020-11-25 DIAGNOSIS — G4721 Circadian rhythm sleep disorder, delayed sleep phase type: Secondary | ICD-10-CM | POA: Diagnosis not present

## 2021-01-20 DIAGNOSIS — Z23 Encounter for immunization: Secondary | ICD-10-CM | POA: Diagnosis not present

## 2021-01-20 DIAGNOSIS — U071 COVID-19: Secondary | ICD-10-CM | POA: Diagnosis not present

## 2021-03-11 DIAGNOSIS — Z23 Encounter for immunization: Secondary | ICD-10-CM | POA: Diagnosis not present

## 2021-03-24 DIAGNOSIS — Z20822 Contact with and (suspected) exposure to covid-19: Secondary | ICD-10-CM | POA: Diagnosis not present

## 2021-03-24 DIAGNOSIS — Z03818 Encounter for observation for suspected exposure to other biological agents ruled out: Secondary | ICD-10-CM | POA: Diagnosis not present

## 2021-04-19 DIAGNOSIS — L03012 Cellulitis of left finger: Secondary | ICD-10-CM | POA: Diagnosis not present

## 2021-05-03 DIAGNOSIS — M85851 Other specified disorders of bone density and structure, right thigh: Secondary | ICD-10-CM | POA: Diagnosis not present

## 2021-05-03 DIAGNOSIS — Z780 Asymptomatic menopausal state: Secondary | ICD-10-CM | POA: Diagnosis not present

## 2021-05-03 DIAGNOSIS — Z1231 Encounter for screening mammogram for malignant neoplasm of breast: Secondary | ICD-10-CM | POA: Diagnosis not present

## 2021-05-03 DIAGNOSIS — M85852 Other specified disorders of bone density and structure, left thigh: Secondary | ICD-10-CM | POA: Diagnosis not present

## 2021-05-05 DIAGNOSIS — H5203 Hypermetropia, bilateral: Secondary | ICD-10-CM | POA: Diagnosis not present

## 2021-05-05 DIAGNOSIS — H43813 Vitreous degeneration, bilateral: Secondary | ICD-10-CM | POA: Diagnosis not present

## 2021-05-05 DIAGNOSIS — H5340 Unspecified visual field defects: Secondary | ICD-10-CM | POA: Diagnosis not present

## 2021-05-05 DIAGNOSIS — Z961 Presence of intraocular lens: Secondary | ICD-10-CM | POA: Diagnosis not present

## 2021-05-11 DIAGNOSIS — F325 Major depressive disorder, single episode, in full remission: Secondary | ICD-10-CM | POA: Diagnosis not present

## 2021-05-11 DIAGNOSIS — G4733 Obstructive sleep apnea (adult) (pediatric): Secondary | ICD-10-CM | POA: Diagnosis not present

## 2021-06-14 DIAGNOSIS — L03011 Cellulitis of right finger: Secondary | ICD-10-CM | POA: Diagnosis not present

## 2021-06-14 DIAGNOSIS — M6740 Ganglion, unspecified site: Secondary | ICD-10-CM | POA: Diagnosis not present

## 2021-06-16 DIAGNOSIS — H43813 Vitreous degeneration, bilateral: Secondary | ICD-10-CM | POA: Diagnosis not present

## 2021-10-14 DIAGNOSIS — G43909 Migraine, unspecified, not intractable, without status migrainosus: Secondary | ICD-10-CM | POA: Diagnosis not present

## 2021-10-14 DIAGNOSIS — H5340 Unspecified visual field defects: Secondary | ICD-10-CM | POA: Diagnosis not present

## 2021-10-14 DIAGNOSIS — H532 Diplopia: Secondary | ICD-10-CM | POA: Diagnosis not present

## 2021-10-14 DIAGNOSIS — H5310 Unspecified subjective visual disturbances: Secondary | ICD-10-CM | POA: Diagnosis not present

## 2021-10-20 DIAGNOSIS — H547 Unspecified visual loss: Secondary | ICD-10-CM | POA: Diagnosis not present

## 2021-10-20 DIAGNOSIS — M2550 Pain in unspecified joint: Secondary | ICD-10-CM | POA: Diagnosis not present

## 2021-10-21 ENCOUNTER — Other Ambulatory Visit: Payer: Self-pay | Admitting: Family Medicine

## 2021-10-21 DIAGNOSIS — H547 Unspecified visual loss: Secondary | ICD-10-CM

## 2021-10-26 ENCOUNTER — Ambulatory Visit
Admission: RE | Admit: 2021-10-26 | Discharge: 2021-10-26 | Disposition: A | Discharge status: Home / Self Care | Payer: Medicare Other | Source: Ambulatory Visit | Attending: Family Medicine | Admitting: Family Medicine

## 2021-10-26 DIAGNOSIS — I6523 Occlusion and stenosis of bilateral carotid arteries: Secondary | ICD-10-CM | POA: Diagnosis not present

## 2021-10-26 DIAGNOSIS — H547 Unspecified visual loss: Secondary | ICD-10-CM

## 2021-10-26 DIAGNOSIS — E785 Hyperlipidemia, unspecified: Secondary | ICD-10-CM | POA: Diagnosis not present

## 2021-12-01 DIAGNOSIS — G4733 Obstructive sleep apnea (adult) (pediatric): Secondary | ICD-10-CM | POA: Diagnosis not present

## 2021-12-13 DIAGNOSIS — F325 Major depressive disorder, single episode, in full remission: Secondary | ICD-10-CM | POA: Diagnosis not present

## 2021-12-13 DIAGNOSIS — E782 Mixed hyperlipidemia: Secondary | ICD-10-CM | POA: Diagnosis not present

## 2021-12-13 DIAGNOSIS — K529 Noninfective gastroenteritis and colitis, unspecified: Secondary | ICD-10-CM | POA: Diagnosis not present

## 2021-12-13 DIAGNOSIS — E669 Obesity, unspecified: Secondary | ICD-10-CM | POA: Diagnosis not present

## 2021-12-13 DIAGNOSIS — Z1331 Encounter for screening for depression: Secondary | ICD-10-CM | POA: Diagnosis not present

## 2021-12-13 DIAGNOSIS — Z23 Encounter for immunization: Secondary | ICD-10-CM | POA: Diagnosis not present

## 2021-12-13 DIAGNOSIS — M722 Plantar fascial fibromatosis: Secondary | ICD-10-CM | POA: Diagnosis not present

## 2021-12-13 DIAGNOSIS — G4733 Obstructive sleep apnea (adult) (pediatric): Secondary | ICD-10-CM | POA: Diagnosis not present

## 2021-12-13 DIAGNOSIS — Z0000 Encounter for general adult medical examination without abnormal findings: Secondary | ICD-10-CM | POA: Diagnosis not present

## 2021-12-23 DIAGNOSIS — G43909 Migraine, unspecified, not intractable, without status migrainosus: Secondary | ICD-10-CM | POA: Diagnosis not present

## 2022-01-30 DIAGNOSIS — Z23 Encounter for immunization: Secondary | ICD-10-CM | POA: Diagnosis not present

## 2022-02-08 DIAGNOSIS — R194 Change in bowel habit: Secondary | ICD-10-CM | POA: Diagnosis not present

## 2022-02-08 DIAGNOSIS — R195 Other fecal abnormalities: Secondary | ICD-10-CM | POA: Diagnosis not present

## 2022-03-21 DIAGNOSIS — E782 Mixed hyperlipidemia: Secondary | ICD-10-CM | POA: Diagnosis not present

## 2022-04-20 DIAGNOSIS — Z1211 Encounter for screening for malignant neoplasm of colon: Secondary | ICD-10-CM | POA: Diagnosis not present

## 2022-04-20 DIAGNOSIS — R195 Other fecal abnormalities: Secondary | ICD-10-CM | POA: Diagnosis not present

## 2022-05-09 DIAGNOSIS — Z1231 Encounter for screening mammogram for malignant neoplasm of breast: Secondary | ICD-10-CM | POA: Diagnosis not present

## 2022-06-28 DIAGNOSIS — H5340 Unspecified visual field defects: Secondary | ICD-10-CM | POA: Diagnosis not present

## 2022-06-28 DIAGNOSIS — H43812 Vitreous degeneration, left eye: Secondary | ICD-10-CM | POA: Diagnosis not present

## 2022-08-15 DIAGNOSIS — R197 Diarrhea, unspecified: Secondary | ICD-10-CM | POA: Diagnosis not present

## 2022-08-15 DIAGNOSIS — K5730 Diverticulosis of large intestine without perforation or abscess without bleeding: Secondary | ICD-10-CM | POA: Diagnosis not present

## 2022-08-15 DIAGNOSIS — K648 Other hemorrhoids: Secondary | ICD-10-CM | POA: Diagnosis not present

## 2022-08-15 DIAGNOSIS — K52832 Lymphocytic colitis: Secondary | ICD-10-CM | POA: Diagnosis not present

## 2022-08-17 DIAGNOSIS — K52832 Lymphocytic colitis: Secondary | ICD-10-CM | POA: Diagnosis not present

## 2022-11-15 DIAGNOSIS — K1120 Sialoadenitis, unspecified: Secondary | ICD-10-CM | POA: Diagnosis not present

## 2022-11-15 DIAGNOSIS — K118 Other diseases of salivary glands: Secondary | ICD-10-CM | POA: Diagnosis not present

## 2022-12-06 DIAGNOSIS — F5101 Primary insomnia: Secondary | ICD-10-CM | POA: Diagnosis not present

## 2022-12-06 DIAGNOSIS — G4733 Obstructive sleep apnea (adult) (pediatric): Secondary | ICD-10-CM | POA: Diagnosis not present

## 2022-12-25 DIAGNOSIS — E782 Mixed hyperlipidemia: Secondary | ICD-10-CM | POA: Diagnosis not present

## 2022-12-25 DIAGNOSIS — Z6834 Body mass index (BMI) 34.0-34.9, adult: Secondary | ICD-10-CM | POA: Diagnosis not present

## 2022-12-25 DIAGNOSIS — F325 Major depressive disorder, single episode, in full remission: Secondary | ICD-10-CM | POA: Diagnosis not present

## 2022-12-25 DIAGNOSIS — Z0000 Encounter for general adult medical examination without abnormal findings: Secondary | ICD-10-CM | POA: Diagnosis not present

## 2022-12-25 DIAGNOSIS — Z23 Encounter for immunization: Secondary | ICD-10-CM | POA: Diagnosis not present

## 2022-12-25 DIAGNOSIS — L989 Disorder of the skin and subcutaneous tissue, unspecified: Secondary | ICD-10-CM | POA: Diagnosis not present

## 2022-12-25 DIAGNOSIS — E669 Obesity, unspecified: Secondary | ICD-10-CM | POA: Diagnosis not present

## 2022-12-25 DIAGNOSIS — G4733 Obstructive sleep apnea (adult) (pediatric): Secondary | ICD-10-CM | POA: Diagnosis not present

## 2022-12-25 DIAGNOSIS — K219 Gastro-esophageal reflux disease without esophagitis: Secondary | ICD-10-CM | POA: Diagnosis not present

## 2022-12-25 DIAGNOSIS — Z1331 Encounter for screening for depression: Secondary | ICD-10-CM | POA: Diagnosis not present

## 2022-12-25 DIAGNOSIS — Z131 Encounter for screening for diabetes mellitus: Secondary | ICD-10-CM | POA: Diagnosis not present

## 2022-12-28 DIAGNOSIS — H5340 Unspecified visual field defects: Secondary | ICD-10-CM | POA: Diagnosis not present

## 2023-01-12 DIAGNOSIS — Z23 Encounter for immunization: Secondary | ICD-10-CM | POA: Diagnosis not present

## 2023-04-16 DIAGNOSIS — L905 Scar conditions and fibrosis of skin: Secondary | ICD-10-CM | POA: Diagnosis not present

## 2023-04-16 DIAGNOSIS — C44629 Squamous cell carcinoma of skin of left upper limb, including shoulder: Secondary | ICD-10-CM | POA: Diagnosis not present

## 2023-04-16 DIAGNOSIS — D485 Neoplasm of uncertain behavior of skin: Secondary | ICD-10-CM | POA: Diagnosis not present

## 2023-05-09 DIAGNOSIS — D225 Melanocytic nevi of trunk: Secondary | ICD-10-CM | POA: Diagnosis not present

## 2023-05-09 DIAGNOSIS — L821 Other seborrheic keratosis: Secondary | ICD-10-CM | POA: Diagnosis not present

## 2023-05-09 DIAGNOSIS — C44629 Squamous cell carcinoma of skin of left upper limb, including shoulder: Secondary | ICD-10-CM | POA: Diagnosis not present

## 2023-05-09 DIAGNOSIS — L814 Other melanin hyperpigmentation: Secondary | ICD-10-CM | POA: Diagnosis not present

## 2023-05-15 DIAGNOSIS — Z1231 Encounter for screening mammogram for malignant neoplasm of breast: Secondary | ICD-10-CM | POA: Diagnosis not present

## 2023-05-28 DIAGNOSIS — C44629 Squamous cell carcinoma of skin of left upper limb, including shoulder: Secondary | ICD-10-CM | POA: Diagnosis not present

## 2023-06-18 DIAGNOSIS — E669 Obesity, unspecified: Secondary | ICD-10-CM | POA: Diagnosis not present

## 2023-06-18 DIAGNOSIS — R7303 Prediabetes: Secondary | ICD-10-CM | POA: Diagnosis not present

## 2023-06-18 DIAGNOSIS — G4733 Obstructive sleep apnea (adult) (pediatric): Secondary | ICD-10-CM | POA: Diagnosis not present

## 2023-06-18 DIAGNOSIS — E782 Mixed hyperlipidemia: Secondary | ICD-10-CM | POA: Diagnosis not present

## 2023-06-18 DIAGNOSIS — F325 Major depressive disorder, single episode, in full remission: Secondary | ICD-10-CM | POA: Diagnosis not present

## 2023-06-18 DIAGNOSIS — Z23 Encounter for immunization: Secondary | ICD-10-CM | POA: Diagnosis not present

## 2023-07-09 DIAGNOSIS — H5340 Unspecified visual field defects: Secondary | ICD-10-CM | POA: Diagnosis not present

## 2023-07-12 DIAGNOSIS — G4733 Obstructive sleep apnea (adult) (pediatric): Secondary | ICD-10-CM | POA: Diagnosis not present

## 2023-11-06 DIAGNOSIS — Z08 Encounter for follow-up examination after completed treatment for malignant neoplasm: Secondary | ICD-10-CM | POA: Diagnosis not present

## 2023-11-06 DIAGNOSIS — L814 Other melanin hyperpigmentation: Secondary | ICD-10-CM | POA: Diagnosis not present

## 2023-11-06 DIAGNOSIS — D225 Melanocytic nevi of trunk: Secondary | ICD-10-CM | POA: Diagnosis not present

## 2023-11-06 DIAGNOSIS — L821 Other seborrheic keratosis: Secondary | ICD-10-CM | POA: Diagnosis not present

## 2023-11-06 DIAGNOSIS — Z85828 Personal history of other malignant neoplasm of skin: Secondary | ICD-10-CM | POA: Diagnosis not present

## 2024-01-10 DIAGNOSIS — R7303 Prediabetes: Secondary | ICD-10-CM | POA: Diagnosis not present

## 2024-01-10 DIAGNOSIS — G4733 Obstructive sleep apnea (adult) (pediatric): Secondary | ICD-10-CM | POA: Diagnosis not present

## 2024-01-10 DIAGNOSIS — F325 Major depressive disorder, single episode, in full remission: Secondary | ICD-10-CM | POA: Diagnosis not present

## 2024-01-10 DIAGNOSIS — Z23 Encounter for immunization: Secondary | ICD-10-CM | POA: Diagnosis not present

## 2024-01-10 DIAGNOSIS — E782 Mixed hyperlipidemia: Secondary | ICD-10-CM | POA: Diagnosis not present

## 2024-01-10 DIAGNOSIS — Z0000 Encounter for general adult medical examination without abnormal findings: Secondary | ICD-10-CM | POA: Diagnosis not present

## 2024-01-10 DIAGNOSIS — Z1331 Encounter for screening for depression: Secondary | ICD-10-CM | POA: Diagnosis not present

## 2024-01-10 DIAGNOSIS — E669 Obesity, unspecified: Secondary | ICD-10-CM | POA: Diagnosis not present
